# Patient Record
Sex: Female | Born: 1945 | Race: White | Hispanic: No | Marital: Single | Smoking: Former smoker
Health system: Southern US, Community
[De-identification: ages and names within clinical notes are randomized; demographics above are authoritative.]

## PROBLEM LIST (undated history)

## (undated) DIAGNOSIS — E079 Disorder of thyroid, unspecified: Secondary | ICD-10-CM

## (undated) DIAGNOSIS — J449 Chronic obstructive pulmonary disease, unspecified: Secondary | ICD-10-CM

## (undated) DIAGNOSIS — E119 Type 2 diabetes mellitus without complications: Secondary | ICD-10-CM

## (undated) DIAGNOSIS — J189 Pneumonia, unspecified organism: Secondary | ICD-10-CM

## (undated) DIAGNOSIS — E039 Hypothyroidism, unspecified: Secondary | ICD-10-CM

## (undated) DIAGNOSIS — T8859XA Other complications of anesthesia, initial encounter: Secondary | ICD-10-CM

## (undated) DIAGNOSIS — I10 Essential (primary) hypertension: Secondary | ICD-10-CM

## (undated) DIAGNOSIS — Z87442 Personal history of urinary calculi: Secondary | ICD-10-CM

## (undated) DIAGNOSIS — T4145XA Adverse effect of unspecified anesthetic, initial encounter: Secondary | ICD-10-CM

## (undated) DIAGNOSIS — D649 Anemia, unspecified: Secondary | ICD-10-CM

## (undated) HISTORY — PX: TONSILLECTOMY: SUR1361

## (undated) HISTORY — PX: ABDOMINAL HYSTERECTOMY: SHX81

## (undated) HISTORY — PX: HEMORRHOIDECTOMY WITH HEMORRHOID BANDING: SHX5633

---

## 2016-03-09 ENCOUNTER — Encounter (HOSPITAL_COMMUNITY): Payer: Self-pay | Admitting: Emergency Medicine

## 2016-03-09 ENCOUNTER — Emergency Department (HOSPITAL_COMMUNITY)
Admission: EM | Admit: 2016-03-09 | Discharge: 2016-03-09 | Disposition: A | Discharge status: Home / Self Care | Payer: Medicare Other | Attending: Emergency Medicine | Admitting: Emergency Medicine

## 2016-03-09 DIAGNOSIS — F17200 Nicotine dependence, unspecified, uncomplicated: Secondary | ICD-10-CM | POA: Diagnosis not present

## 2016-03-09 DIAGNOSIS — T7840XA Allergy, unspecified, initial encounter: Secondary | ICD-10-CM | POA: Diagnosis not present

## 2016-03-09 DIAGNOSIS — I10 Essential (primary) hypertension: Secondary | ICD-10-CM | POA: Insufficient documentation

## 2016-03-09 DIAGNOSIS — R220 Localized swelling, mass and lump, head: Secondary | ICD-10-CM | POA: Diagnosis present

## 2016-03-09 HISTORY — DX: Essential (primary) hypertension: I10

## 2016-03-09 HISTORY — DX: Disorder of thyroid, unspecified: E07.9

## 2016-03-09 MED ORDER — DIPHENHYDRAMINE HCL 50 MG/ML IJ SOLN
25.0000 mg | Freq: Once | INTRAMUSCULAR | Status: AC
  Administered 2016-03-09: 25 mg via INTRAVENOUS
  Filled 2016-03-09: qty 1

## 2016-03-09 MED ORDER — METHYLPREDNISOLONE SODIUM SUCC 125 MG IJ SOLR
125.0000 mg | Freq: Once | INTRAMUSCULAR | Status: AC
  Administered 2016-03-09: 125 mg via INTRAVENOUS
  Filled 2016-03-09: qty 2

## 2016-03-09 MED ORDER — AZITHROMYCIN 250 MG PO TABS: 250.0000 mg | ORAL_TABLET | Freq: Every day | ORAL | 0 refills | Status: DC

## 2016-03-09 NOTE — Discharge Instructions (Signed)
Refrain from taking your Benzapril and Augmentin. Take your new antibiotic as prescribed for your sinus infection. You may also continue to take benadryl as prescribed over the counter as needed until the swelling of your tongue has completely resolved. Follow up with your primary care provider in 2 days for follow up and management of your blood pressure since your BP was noted to be lower in the ED today and recently stopping your Benzapril due to concern for angioedema reaction. Please return to the Emergency Department if symptoms worsen or new onset of fever, facial/neck swelling, swelling of tongue, difficulty swallowing, unable swallow resulting in drooling, sensation of throat closing, difficulty breathing, wheezing.

## 2016-03-09 NOTE — ED Provider Notes (Signed)
[facility] DEPT Provider Note   CSN: [id number] Arrival date & time: 03/09/16  2426     History   Chief Complaint Chief Complaint  Patient presents with  . Oral Swelling    right side of tongue is swollen    HPI Diane Olson is a 70 y.o. female.  Patient is a 70 year old female with past medical history of hypertension and thyroid disease who presents the ED with complaint of swelling to the right side of her tongue, onset 2 hours prior to arrival. Patient reports when she woke up this morning she noticed swelling to the right side of her tongue. She notes the swelling has remained constant over the past 2 hours. Denies fever, difficulty breathing, wheezing, sensation of throat closing, difficulty swallowing, voice change, headache, drooling, abdominal pain, vomiting. Patient notes she was seen by her PCP yesterday for her sinus infection and states she was started on Augmentin which she took last night. Patient states she has taken Augmentin in the past without any side effects. She notes the only other medication she currently is taking are metoprolol and Benzapril (which she notes she has been taking for the past 5-10 years). Denies any other new medications, foods or liquids. Patient denies any known allergies.      Past Medical History:  Diagnosis Date  . Hypertension   . Thyroid disease     There are no active problems to display for this patient.   No past surgical history on file.  OB History    No data available       Home Medications    Prior to Admission medications   Medication Sig Start Date End Date Taking? Authorizing Provider  azithromycin (ZITHROMAX) 250 MG tablet Take 1 tablet (250 mg total) by mouth daily. Take first 2 tablets together, then 1 every day until finished. 03/09/16   Nona Dell, PA-C    Family History No family history on file.  Social History Social History  Substance Use Topics  . Smoking status: Current Every  Day Smoker  . Smokeless tobacco: Current User  . Alcohol use No     Allergies   Review of patient's allergies indicates not on file.   Review of Systems Review of Systems  HENT:       Swelling of right side of tongue  All other systems reviewed and are negative.    Physical Exam Updated Vital Signs BP 93/68 (BP Location: Left Arm)   Pulse 88   Temp 98.1 F (36.7 C) (Oral)   Resp 16   Ht '5\' 3"'$  (1.6 m)   Wt 53.1 kg   SpO2 96%   BMI 20.73 kg/m   Physical Exam  Constitutional: She is oriented to person, place, and time. She appears well-developed and well-nourished. No distress.  Healthy, thin appearing elderly female  HENT:  Head: Normocephalic and atraumatic.  Mouth/Throat: Uvula is midline, oropharynx is clear and moist and mucous membranes are normal. No oral lesions. No trismus in the jaw. No uvula swelling. No oropharyngeal exudate, posterior oropharyngeal edema, posterior oropharyngeal erythema or tonsillar abscesses. No tonsillar exudate.  Moderate swelling noted to right side of tongue, still able to visualize bilateral tonsils and posterior oropharynx. Floor of mouth soft. Pt tolerating secretions.   Eyes: Conjunctivae and EOM are normal. Right eye exhibits no discharge. Left eye exhibits no discharge. No scleral icterus.  Neck: Normal range of motion. Neck supple.  No facial or neck swelling  Cardiovascular: Normal rate, regular rhythm,  normal heart sounds and intact distal pulses.   Pulmonary/Chest: Effort normal and breath sounds normal. No respiratory distress. She has no wheezes. She has no rales. She exhibits no tenderness.  Airway intact.  Abdominal: Soft. Bowel sounds are normal. She exhibits no distension and no mass. There is no tenderness. There is no rebound and no guarding. No hernia.  Musculoskeletal: Normal range of motion. She exhibits no edema.  Neurological: She is alert and oriented to person, place, and time.  Skin: Skin is warm and dry. She is  not diaphoretic.  Nursing note and vitals reviewed.    ED Treatments / Results  Labs (all labs ordered are listed, but only abnormal results are displayed) Labs Reviewed - No data to display  EKG  EKG Interpretation None       Radiology No results found.  Procedures Procedures (including critical care time)  Medications Ordered in ED Medications  diphenhydrAMINE (BENADRYL) injection 25 mg (25 mg Intravenous Given 03/09/16 0804)  methylPREDNISolone sodium succinate (SOLU-MEDROL) 125 mg/2 mL injection 125 mg (125 mg Intravenous Given 03/09/16 0804)     Initial Impression / Assessment and Plan / ED Course  I have reviewed the triage vital signs and the nursing notes.  Pertinent labs & imaging results that were available during my care of the patient were reviewed by me and considered in my medical decision making (see chart for details).  Clinical Course   Patient presents with swelling to the right side of her tongue that started 2 hours prior to arrival and has remained constant and unchanged. Reports starting Augmentin yesterday for sinus infection but notes she has taken it before without any side effects. Denies any other new medications or changes in her home meds, endorses taking metoprolol and benzapril for HTN. Initial vitals, HR 109, BP 93/75, remaining vitals stable. Exam revealed moderate swelling to right side of tongue, pt tolerating secretions, airway intact. Pt without any signs of respiratory distress. Pt given IV benadryl and steroids.   8:30am- On reevaluation patient is resting comfortably in bed. Denies worsening of tongue swelling. On reexamination, swelling of right side of tongue is unchanged. Airway remains intact. Patient tolerating secretions and PO without any complications. Repeat vitals HR 86, BP 93/69. Pt denies headache, lightheadedness, dizziness, weakness, numbness, tingling, weakness, nausea. Pt able to stand and ambulate without assistance and  denies any sxs.   10:30am- on reevaluation patient reports improvement of tongue swelling. Reexamination revealed significant improvement of swelling to right side of tongue. Patient was observed in the ED for over 3 hours with improvement of symptoms, pt is hemodynamically stable. BP unchanged, pt asymptomatic and able to ambulate. Advised pt to refrain from taking her BP medication this morning. Plan to d/c pt home. Advised pt to refrain from taking benzapril due to suspicion of mild angioedema side effect seen one exam. Daughter is also concerned about pt having reaction to Augmentin and is requesting a different antibiotic for sinus infection. Will start pt on azithromycin. Advised pt to follow up with her PCP in 2 days for follow up regarding further management of BP and recent medication changes. Discussed strict return precautions with pt.   Final Clinical Impressions(s) / ED Diagnoses   Final diagnoses:  Allergic reaction, initial encounter    New Prescriptions New Prescriptions   AZITHROMYCIN (ZITHROMAX) 250 MG TABLET    Take 1 tablet (250 mg total) by mouth daily. Take first 2 tablets together, then 1 every day until finished.  Chesley Noon [place], [place] 03/09/16 [place], MD 03/10/16 [phone]

## 2016-03-09 NOTE — ED Triage Notes (Signed)
Pt. Stated, about 2 hours ago the right side of my tongue is swollen

## 2017-09-01 ENCOUNTER — Other Ambulatory Visit: Payer: Self-pay

## 2017-09-01 ENCOUNTER — Emergency Department (HOSPITAL_COMMUNITY): Payer: Medicare Other

## 2017-09-01 ENCOUNTER — Encounter (HOSPITAL_COMMUNITY): Payer: Self-pay | Admitting: *Deleted

## 2017-09-01 ENCOUNTER — Emergency Department (HOSPITAL_COMMUNITY)
Admission: EM | Admit: 2017-09-01 | Discharge: 2017-09-01 | Disposition: A | Discharge status: Home / Self Care | Payer: Medicare Other | Attending: Emergency Medicine | Admitting: Emergency Medicine

## 2017-09-01 DIAGNOSIS — F17210 Nicotine dependence, cigarettes, uncomplicated: Secondary | ICD-10-CM | POA: Insufficient documentation

## 2017-09-01 DIAGNOSIS — R918 Other nonspecific abnormal finding of lung field: Secondary | ICD-10-CM | POA: Diagnosis not present

## 2017-09-01 DIAGNOSIS — R05 Cough: Secondary | ICD-10-CM | POA: Diagnosis present

## 2017-09-01 DIAGNOSIS — E039 Hypothyroidism, unspecified: Secondary | ICD-10-CM | POA: Diagnosis not present

## 2017-09-01 DIAGNOSIS — I10 Essential (primary) hypertension: Secondary | ICD-10-CM | POA: Insufficient documentation

## 2017-09-01 DIAGNOSIS — R059 Cough, unspecified: Secondary | ICD-10-CM

## 2017-09-01 LAB — COMPREHENSIVE METABOLIC PANEL
ALT: 14 U/L (ref 14–54)
ANION GAP: 13 (ref 5–15)
AST: 22 U/L (ref 15–41)
Albumin: 4 g/dL (ref 3.5–5.0)
Alkaline Phosphatase: 54 U/L (ref 38–126)
BUN: 15 mg/dL (ref 6–20)
CHLORIDE: 101 mmol/L (ref 101–111)
CO2: 26 mmol/L (ref 22–32)
CREATININE: 0.83 mg/dL (ref 0.44–1.00)
Calcium: 10.1 mg/dL (ref 8.9–10.3)
GFR calc non Af Amer: >60 mL/min (ref >60)
Glucose, Bld: 101 mg/dL — ABNORMAL HIGH (ref 65–99)
Potassium: 3.6 mmol/L (ref 3.5–5.1)
SODIUM: 140 mmol/L (ref 135–145)
Total Bilirubin: 0.7 mg/dL (ref 0.3–1.2)
Total Protein: 7.7 g/dL (ref 6.5–8.1)

## 2017-09-01 LAB — CBC WITH DIFFERENTIAL/PLATELET
Basophils Absolute: 0 10*3/uL (ref 0.0–0.1)
Basophils Relative: 0 %
EOS ABS: 0.1 10*3/uL (ref 0.0–0.7)
EOS PCT: 1 %
HCT: 43.1 % (ref 36.0–46.0)
Hemoglobin: 14.3 g/dL (ref 12.0–15.0)
LYMPHS ABS: 2 10*3/uL (ref 0.7–4.0)
Lymphocytes Relative: 18 %
MCH: 28.9 pg (ref 26.0–34.0)
MCHC: 33.2 g/dL (ref 30.0–36.0)
MCV: 87.2 fL (ref 78.0–100.0)
Monocytes Absolute: 0.8 10*3/uL (ref 0.1–1.0)
Monocytes Relative: 7 %
Neutro Abs: 8.2 10*3/uL — ABNORMAL HIGH (ref 1.7–7.7)
Neutrophils Relative %: 74 %
PLATELETS: 303 10*3/uL (ref 150–400)
RBC: 4.94 MIL/uL (ref 3.87–5.11)
RDW: 14 % (ref 11.5–15.5)
WBC: 11.1 10*3/uL — AB (ref 4.0–10.5)

## 2017-09-01 MED ORDER — IOPAMIDOL (ISOVUE-300) INJECTION 61%
INTRAVENOUS | Status: AC
  Administered 2017-09-01: 75 mL via INTRAVENOUS
  Filled 2017-09-01: qty 75

## 2017-09-01 MED ORDER — DM-GUAIFENESIN ER 30-600 MG PO TB12: 1.0000 | ORAL_TABLET | Freq: Two times a day (BID) | ORAL | 0 refills | Status: DC

## 2017-09-01 NOTE — ED Notes (Signed)
Transported to CT 

## 2017-09-01 NOTE — ED Triage Notes (Addendum)
To ED for eval of 2 episodes of blood in sputum since yesterday. No SOB. States she was dx with pneumonia in Jan and has a lingering cough since. Appears in nad. Hx of HTN and taking meds

## 2017-09-01 NOTE — Discharge Instructions (Addendum)
Your chest CT scan shows a 3x4cm mass in the left lung concerning for lung cancer, I have placed a referral to the oncology clinic if you do not hear from them in the next few days please give them a call, you will also need to follow-up with your primary care doctor in the next few days.  You may use the cough medicine prescribed as needed please follow-up with your primary doctor for continued management of this cough.  If you develop chest pain, shortness of breath, fevers or chills, or cough up frank blood, not just blood mixed in the sputum please return to the ED immediately for reevaluation.

## 2017-09-01 NOTE — ED Provider Notes (Signed)
[facility] EMERGENCY DEPARTMENT Provider Note   CSN: [id number] Arrival date & time: 09/01/17  4034     History   Chief Complaint Chief Complaint  Patient presents with  . Cough    HPI Diane Olson is a 72 y.o. female.  Diane Olson is a 72 y.o. Female with a history of hypertension, thyroid disease and tobacco use, presents to the ED for evaluation of persistent cough with a few episodes of blood-streaked sputum over the past 3 days.  She reports she was diagnosed with pneumonia at the very end of January, treated with steroids and doxycycline.  Patient reports overall symptoms improved but she has had a lingering cough since then.  Cough seems to be improving overall but over the past few days she has had increased coughing, specifically when she wakes up first thing in the morning she has some rapid breathing and intense coughing spell and then has coughed up mucus with some blood streaked in it.  No episodes of gross hemoptysis.  Patient denies chest pain or shortness of breath at rest only some soreness from frequent coughing.  She has been using Delsym, has not tried anything else for cough.  No fevers or chills.  No abdominal pain, nausea, vomiting or diarrhea.  Patient reports she is still an active smoker and smokes somewhere between half a pack to a pack a day      Past Medical History:  Diagnosis Date  . Hypertension   . Thyroid disease     There are no active problems to display for this patient.   Past Surgical History:  Procedure Laterality Date  . ABDOMINAL HYSTERECTOMY      OB History    No data available       Home Medications    Prior to Admission medications   Medication Sig Start Date End Date Taking? Authorizing Provider  azithromycin (ZITHROMAX) 250 MG tablet Take 1 tablet (250 mg total) by mouth daily. Take first 2 tablets together, then 1 every day until finished. 03/09/16   Nona Dell, PA-C    Family  History No family history on file.  Social History Social History   Tobacco Use  . Smoking status: Current Every Day Smoker  . Smokeless tobacco: Current User  Substance Use Topics  . Alcohol use: No  . Drug use: No     Allergies   Augmentin [amoxicillin-pot clavulanate] and Lisinopril   Review of Systems Review of Systems  Constitutional: Negative for chills and fever.  HENT: Negative for congestion, rhinorrhea and sore throat.   Eyes: Negative for visual disturbance.  Respiratory: Positive for cough. Negative for chest tightness, shortness of breath and wheezing.   Cardiovascular: Negative for chest pain.  Gastrointestinal: Negative for abdominal pain, diarrhea, nausea and vomiting.  Genitourinary: Negative for dysuria.  Musculoskeletal: Negative for arthralgias and myalgias.  Skin: Negative for rash.  Neurological: Negative for dizziness, weakness, light-headedness and headaches.  All other systems reviewed and are negative.    Physical Exam Updated Vital Signs BP 123/90 (BP Location: Right Arm)   Pulse 92   Temp 98.4 F (36.9 C) (Oral)   Resp 18   SpO2 99%   Physical Exam  Constitutional: She appears well-developed and well-nourished. No distress.  HENT:  Head: Normocephalic and atraumatic.  Mouth/Throat: Oropharynx is clear and moist.  TMs clear with good landmarks, mild nasal mucosa edema with clear rhinorrhea, posterior oropharynx clear and moist, with some erythema, no edema or exudates  Eyes: Right eye exhibits no discharge. Left eye exhibits no discharge.  Neck: Neck supple.  Cardiovascular: Normal rate, regular rhythm and normal heart sounds.  Pulmonary/Chest: Effort normal. No stridor. No respiratory distress. She has no wheezes. She has no rales.  Few scattered crackles feel this is likely chronic given patient's smoking history, lungs otherwise clear with good air movement throughout  Abdominal: Soft. Bowel sounds are normal. She exhibits no  distension and no mass. There is no tenderness. There is no guarding.  Musculoskeletal: She exhibits no edema or deformity.  Neurological: She is alert. Coordination normal.  Skin: Skin is warm and dry. Capillary refill takes less than 2 seconds. She is not diaphoretic.  Psychiatric: She has a normal mood and affect. Her behavior is normal.  Nursing note and vitals reviewed.    ED Treatments / Results  Labs (all labs ordered are listed, but only abnormal results are displayed) Labs Reviewed  CBC WITH DIFFERENTIAL/PLATELET - Abnormal; Notable for the following components:      Result Value   WBC 11.1 (*)    Neutro Abs 8.2 (*)    All other components within normal limits  COMPREHENSIVE METABOLIC PANEL - Abnormal; Notable for the following components:   Glucose, Bld 101 (*)    All other components within normal limits    EKG  EKG Interpretation None       Radiology Dg Chest 2 View  Result Date: 09/01/2017 CLINICAL DATA:  Two episodes of hemoptysis since yesterday. No shortness of breath, persistent cough since diagnosis with pneumonia in January 2019. Current smoker. EXAM: CHEST  2 VIEW COMPARISON:  None in PACs FINDINGS: There is abnormal soft tissue density in the left infrahilar region likely in the anterior aspect of the lower lobe. More peripherally there is subtle nodular density measuring just under 1 cm in diameter. The right lung is well-expanded. The interstitial markings are both lungs are coarse. There is no significant pleural effusion. The heart and pulmonary vascularity are normal. There is calcification in the wall of the thoracic aorta. There is a small amount of apical pleural thickening bilaterally. The bony thorax is unremarkable. IMPRESSION: COPD or reactive airway disease. Abnormal soft tissue density in the left infrahilar region and more peripherally in the left lower lobe worrisome for malignancy. Atypical pneumonia could be present. Chest CT scanning is  recommended. Thoracic aortic atherosclerosis. Electronically Signed   By: David  Martinique M.D.   On: 09/01/2017 10:16   Ct Chest W Contrast  Result Date: 09/01/2017 CLINICAL DATA:  Persistent lingering cough. Recent pneumonia. Occasional hemoptysis. Findings on radiographs worrisome for malignancy. EXAM: CT CHEST WITH CONTRAST TECHNIQUE: Multidetector CT imaging of the chest was performed during intravenous contrast administration. CONTRAST:  <75 cc ISOVUE-300 IOPAMIDOL (ISOVUE-300) INJECTION 61% COMPARISON:  Radiographs 09/01/2017.  No other comparison studies. FINDINGS: Cardiovascular: Diffuse atherosclerosis of the aorta, great vessels and coronary arteries. No acute vascular findings are evident. The heart size is normal. There is no pericardial effusion. Mediastinum/Nodes: There is a large central mediastinal mass which splays the carina and exerts mass effect on the central bronchi bilaterally. This measures approximately 5.2 x 4.3 cm on image 58/3. This mass is contiguous with subcarinal and right hilar adenopathy, the latter measuring up to 2.3 x 1.8 cm on image 65/3. Central mediastinal mass indents the trachea anteriorly and may invade it (image 49/3). There is a small hiatal hernia. The thyroid gland is not clearly visualized. Lungs/Pleura: There is no pleural effusion or pneumothorax. There  is a left infrahilar mass measuring 4.1 x 3.1 cm (image 87/4). This obstructs the anteromedial basal segment of the left lower lobe bronchus and appears cavitary peripherally. There is mild patchy airspace disease peripheral to this mass. No other suspicious pulmonary nodules. There is mild biapical scarring and mild centrilobular emphysema. Upper abdomen: No highly suspect findings within the visualized upper abdomen. There are several low-density hepatic lesions which are probably cysts. There are probable left renal cysts. No evidence of adrenal mass. Musculoskeletal/Chest wall: There is no chest wall mass or  suspicious osseous finding. IMPRESSION: 1. 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 There is associated central mediastinal lymphadenopathy with mass effect on the tracheobronchial tree and possible tracheal invasion. Tissue sampling recommended. 2. No distant metastases identified within the chest. Mild emphysema and biapical scarring. 3.  Aortic Atherosclerosis (ICD10-I70.0). 4. No highly suspect findings in the visualized upper abdomen. Electronically Signed   By: Richardean Sale M.D.   On: 09/01/2017 14:23    Procedures Procedures (including critical care time)  Medications Ordered in ED Medications  iopamidol (ISOVUE-300) 61 % injection (75 mLs Intravenous Contrast Given 09/01/17 1401)     Initial Impression / Assessment and Plan / ED Course  I have reviewed the triage vital signs and the nursing notes.  Pertinent labs & imaging results that were available during my care of the patient were reviewed by me and considered in my medical decision making (see chart for details).  Patient presents to the ED for evaluation of persistent coughing for 1 month after diagnosis of pneumonia, with 3 episodes of blood-streaked sputum over the past few days.  Patient initially hypertensive but vitals otherwise normal and hypertension resolved without intervention.  No evidence of respiratory distress, patient denies shortness of breath or chest pain.  Chest x-ray ordered from triage shows abnormal soft tissue density in the left hilar region concerning for malignancy versus atypical pneumonia, suggest follow-up with CT of the chest.  Discussed these findings with patient and daughter will proceed with basic labs and contrasted CT of the chest.  Labs show mild leukocytosis of 11.1, normal hemoglobin and platelets.  No electrolyte derangements requiring intervention.  Chest CT shows a cavitary left lower lobe mass that is highly suspicious for bronchogenic carcinoma, there is  associated central mediastinal lymphadenopathy with mass-effect on the tracheobronchial tree and possible tracheal invasion.  Biopsy is recommended.  CT shows mild apical scarring and emphysema, no distant chest metastasis or highly suspect findings visualized in the upper abdomen.  Results discussed with Dr. Rogene Houston given that patient is not acutely symptomatic is not experiencing any chest pain or shortness of breath and has had normal vitals throughout her stay here today feels she is stable for outpatient follow-up and evaluation of this, will provide referral to the oncology clinic and have patient follow-up with her primary care provider as well.  Discussed these results at length with patient and daughter, provided copy of CT report to the patient, discussed next steps to anticipate with oncology.  Patient expresses understanding.  At this time she is stable for discharge home, discussed with patient that if she does not hear from the oncology clinic in the next 2 days she is to give them a call herself, phone number provided on paperwork.  Strict return precautions discussed with the patient she expresses understanding and is in agreement with plan.  Patient discussed with Dr. Rogene Houston who is in agreement with plan.  Final Clinical Impressions(s) /  ED Diagnoses   Final diagnoses:  Lung mass  Cough    ED Discharge Orders        Ordered    Ambulatory referral to Hematology / Oncology     09/01/17 1517    [facility]orphan[facility]) 30-600 MG 12hr tablet  2 times daily     09/01/17 1522       Jacqlyn Larsen, [place] 09/01/17 1719    Fredia Sorrow, MD 09/02/17 [phone]

## 2017-09-01 NOTE — ED Notes (Signed)
Patient returned from CT

## 2017-09-02 ENCOUNTER — Telehealth: Payer: Self-pay | Admitting: Internal Medicine

## 2017-09-02 ENCOUNTER — Encounter: Payer: Self-pay | Admitting: Internal Medicine

## 2017-09-02 NOTE — Telephone Encounter (Signed)
Appt has been scheduled for the pt to see Dr. Julien Nordmann on 3/18 at 215pm. Pt aware to arrive 30 minutes early. Letter mailed.

## 2017-09-12 ENCOUNTER — Other Ambulatory Visit: Payer: Self-pay | Admitting: Medical Oncology

## 2017-09-12 DIAGNOSIS — R918 Other nonspecific abnormal finding of lung field: Secondary | ICD-10-CM

## 2017-09-15 ENCOUNTER — Inpatient Hospital Stay: Payer: Medicare Other | Attending: Internal Medicine | Admitting: Internal Medicine

## 2017-09-15 ENCOUNTER — Encounter: Payer: Self-pay | Admitting: Internal Medicine

## 2017-09-15 ENCOUNTER — Inpatient Hospital Stay: Payer: Medicare Other

## 2017-09-15 ENCOUNTER — Other Ambulatory Visit: Payer: Self-pay | Admitting: Medical Oncology

## 2017-09-15 DIAGNOSIS — R5383 Other fatigue: Secondary | ICD-10-CM | POA: Insufficient documentation

## 2017-09-15 DIAGNOSIS — Z8701 Personal history of pneumonia (recurrent): Secondary | ICD-10-CM | POA: Insufficient documentation

## 2017-09-15 DIAGNOSIS — Z79899 Other long term (current) drug therapy: Secondary | ICD-10-CM | POA: Insufficient documentation

## 2017-09-15 DIAGNOSIS — E785 Hyperlipidemia, unspecified: Secondary | ICD-10-CM | POA: Diagnosis not present

## 2017-09-15 DIAGNOSIS — E039 Hypothyroidism, unspecified: Secondary | ICD-10-CM | POA: Diagnosis not present

## 2017-09-15 DIAGNOSIS — I700 Atherosclerosis of aorta: Secondary | ICD-10-CM | POA: Insufficient documentation

## 2017-09-15 DIAGNOSIS — J189 Pneumonia, unspecified organism: Secondary | ICD-10-CM | POA: Diagnosis not present

## 2017-09-15 DIAGNOSIS — Z716 Tobacco abuse counseling: Secondary | ICD-10-CM | POA: Insufficient documentation

## 2017-09-15 DIAGNOSIS — I10 Essential (primary) hypertension: Secondary | ICD-10-CM | POA: Diagnosis not present

## 2017-09-15 DIAGNOSIS — R918 Other nonspecific abnormal finding of lung field: Secondary | ICD-10-CM | POA: Diagnosis present

## 2017-09-15 DIAGNOSIS — C3490 Malignant neoplasm of unspecified part of unspecified bronchus or lung: Secondary | ICD-10-CM

## 2017-09-15 DIAGNOSIS — K769 Liver disease, unspecified: Secondary | ICD-10-CM | POA: Diagnosis not present

## 2017-09-15 DIAGNOSIS — E119 Type 2 diabetes mellitus without complications: Secondary | ICD-10-CM | POA: Insufficient documentation

## 2017-09-15 DIAGNOSIS — J984 Other disorders of lung: Secondary | ICD-10-CM

## 2017-09-15 DIAGNOSIS — R042 Hemoptysis: Secondary | ICD-10-CM | POA: Insufficient documentation

## 2017-09-15 DIAGNOSIS — Z801 Family history of malignant neoplasm of trachea, bronchus and lung: Secondary | ICD-10-CM | POA: Diagnosis not present

## 2017-09-15 DIAGNOSIS — F17210 Nicotine dependence, cigarettes, uncomplicated: Secondary | ICD-10-CM | POA: Diagnosis not present

## 2017-09-15 DIAGNOSIS — J439 Emphysema, unspecified: Secondary | ICD-10-CM

## 2017-09-15 LAB — CMP (CANCER CENTER ONLY)
ALBUMIN: 3.9 g/dL (ref 3.5–5.0)
ALK PHOS: 59 U/L (ref 40–150)
ALT: 12 U/L (ref 0–55)
AST: 11 U/L (ref 5–34)
Anion gap: 13 — ABNORMAL HIGH (ref 3–11)
BILIRUBIN TOTAL: 0.4 mg/dL (ref 0.2–1.2)
BUN: 23 mg/dL (ref 7–26)
CALCIUM: 11 mg/dL — AB (ref 8.4–10.4)
CO2: 27 mmol/L (ref 22–29)
Chloride: 100 mmol/L (ref 98–109)
Creatinine: 0.89 mg/dL (ref 0.60–1.10)
GFR, Est AFR Am: >60 mL/min (ref >60)
GFR, Estimated: >60 mL/min (ref >60)
GLUCOSE: 99 mg/dL (ref 70–140)
Potassium: 3.8 mmol/L (ref 3.5–5.1)
Sodium: 140 mmol/L (ref 136–145)
TOTAL PROTEIN: 7.8 g/dL (ref 6.4–8.3)

## 2017-09-15 LAB — CBC WITH DIFFERENTIAL (CANCER CENTER ONLY)
BASOS ABS: 0.1 10*3/uL (ref 0.0–0.1)
BASOS PCT: 1 %
Eosinophils Absolute: 0 10*3/uL (ref 0.0–0.5)
Eosinophils Relative: 0 %
HCT: 45.6 % (ref 34.8–46.6)
Hemoglobin: 15 g/dL (ref 11.6–15.9)
LYMPHS PCT: 12 %
Lymphs Abs: 1.7 10*3/uL (ref 0.9–3.3)
MCH: 28.3 pg (ref 25.1–34.0)
MCHC: 32.9 g/dL (ref 31.5–36.0)
MCV: 86.1 fL (ref 79.5–101.0)
MONO ABS: 1.1 10*3/uL — AB (ref 0.1–0.9)
Monocytes Relative: 8 %
Neutro Abs: 11.1 10*3/uL — ABNORMAL HIGH (ref 1.5–6.5)
Neutrophils Relative %: 79 %
Platelet Count: 290 10*3/uL (ref 145–400)
RBC: 5.3 MIL/uL (ref 3.70–5.45)
RDW: 14.4 % (ref 11.2–14.5)
WBC Count: 14 10*3/uL — ABNORMAL HIGH (ref 3.9–10.3)

## 2017-09-15 NOTE — Progress Notes (Signed)
[place] [phone]   [phone]  CONSULT NOTE  REFERRING PHYSICIAN: Benedetto Goad, PA-C  REASON FOR CONSULTATION:  72 years old white female with questionable lung cancer.  HPI Diane Olson is a 72 y.o. female with past medical history significant for hypertension, dyslipidemia, diabetes mellitus, hypothyroidism and long history for smoking.  The patient mentioned that in January 2019 she was diagnosed with pneumonia and treated with a course of antibiotics.  She felt better initially then she started having laryngitis and persistent cough.  2 weeks ago she had 2 episodes of cough with hemoptysis as well as shortness of breath.  She presented to the emergency department at [facility].  Chest x-ray on 09/01/2017 showed abnormal soft tissue density in the left infrahilar region.  This was followed by CT scan of the chest on the same day and that showed a large central mediastinal mass which splays the carina and exerts mass-effect on the central bronchi bilaterally.  This measured 5.2 x 4.3 cm.  The mass is contiguous with the subcarinal and right hilar adenopathy.  The later measuring up to 2.3 x 1.8 cm.  There is central mediastinal mass indents the trachea anteriorly and may invade it.  There was also a left infrahilar mass measuring 4.1 x 3.1 cm and this mass obstructs the anterior medial basal segment of the left lower lobe bronchus and appears cavitary peripherally.  No other distant metastases identified within the chest. The patient was referred to me today for evaluation and recommendation regarding these abnormal findings. When seen today she continues to complain of cough productive of whitish and occasional bloody sputum.  She denied having any chest pain but has shortness of breath at baseline increased with cough.  She lost around 8 pounds in the last 3 months.  She denied having any nausea, vomiting, diarrhea or constipation.  She denied  having any fever or chills.  She has no headache or visual changes. Family history significant for mother with lung cancer in her 5s, brother also had lung cancer, father died before she was born. The patient is single and has 2 daughters.  She was accompanied today by her daughter [name].  She used to work in [employer].  She has a history for smoking less than 1 pack/day for around 60 years and unfortunately she continues to smoke and I strongly encouraged her to quit smoking.  She has no history of alcohol or drug abuse.  HPI  Past Medical History:  Diagnosis Date  . Hypertension   . Thyroid disease     Past Surgical History:  Procedure Laterality Date  . ABDOMINAL HYSTERECTOMY      No family history on file.  Social History Social History   Tobacco Use  . Smoking status: Current Every Day Smoker    Packs/day: 0.50    Years: 60.00    Pack years: 30.00  Substance Use Topics  . Alcohol use: No  . Drug use: No    Allergies  Allergen Reactions  . Amoxicillin-Pot Clavulanate Swelling  . Benazepril Swelling  . Lisinopril     Current Outpatient Medications  Medication Sig Dispense Refill  . azithromycin (ZITHROMAX) 250 MG tablet Take 1 tablet (250 mg total) by mouth daily. Take first 2 tablets together, then 1 every day until finished. 6 tablet 0  . dextromethorphan-guaiFENesin (MUCINEX DM) 30-600 MG 12hr tablet Take 1 tablet by mouth 2 (two) times daily. 30 tablet 0  No current facility-administered medications for this visit.     Review of Systems  Constitutional: positive for fatigue and weight loss Eyes: negative Ears, nose, mouth, throat, and face: negative Respiratory: positive for cough, dyspnea on exertion, hemoptysis and sputum Cardiovascular: negative Gastrointestinal: negative Genitourinary:negative Integument/breast: negative Hematologic/lymphatic: negative Musculoskeletal:negative Neurological: negative Behavioral/Psych:  negative Endocrine: negative Allergic/Immunologic: negative  Physical Exam  KDT:OIZTI, healthy, no distress, well nourished, well developed and anxious SKIN: skin color, texture, turgor are normal, no rashes or significant lesions HEAD: Normocephalic, No masses, lesions, tenderness or abnormalities EYES: normal, PERRLA, Conjunctiva are pink and non-injected EARS: External ears normal, Canals clear OROPHARYNX:no exudate, no erythema and lips, buccal mucosa, and tongue normal  NECK: supple, no adenopathy, no JVD LYMPH:  no palpable lymphadenopathy, no hepatosplenomegaly BREAST:not examined LUNGS: scattered rhonchi bilaterally, scattered rales bilaterally HEART: regular rate & rhythm, no murmurs and no gallops ABDOMEN:abdomen soft, non-tender, normal bowel sounds and no masses or organomegaly BACK: Back symmetric, no curvature., No CVA tenderness EXTREMITIES:no joint deformities, effusion, or inflammation, no edema  NEURO: alert & oriented x 3 with fluent speech, no focal motor/sensory deficits  PERFORMANCE STATUS: ECOG 1  LABORATORY DATA: Lab Results  Component Value Date   WBC 14.0 (H) 09/15/2017   HGB 14.3 09/01/2017   HCT 45.6 09/15/2017   MCV 86.1 09/15/2017   PLT 290 09/15/2017      Chemistry      Component Value Date/Time   NA 140 09/15/2017 1403   K 3.8 09/15/2017 1403   CL 100 09/15/2017 1403   CO2 27 09/15/2017 1403   BUN 23 09/15/2017 1403   CREATININE 0.89 09/15/2017 1403      Component Value Date/Time   CALCIUM 11.0 (H) 09/15/2017 1403   ALKPHOS 59 09/15/2017 1403   AST 11 09/15/2017 1403   ALT 12 09/15/2017 1403   BILITOT 0.4 09/15/2017 1403       RADIOGRAPHIC STUDIES: Dg Chest 2 View  Result Date: 09/01/2017 CLINICAL DATA:  Two episodes of hemoptysis since yesterday. No shortness of breath, persistent cough since diagnosis with pneumonia in January 2019. Current smoker. EXAM: CHEST  2 VIEW COMPARISON:  None in PACs FINDINGS: There is abnormal soft  tissue density in the left infrahilar region likely in the anterior aspect of the lower lobe. More peripherally there is subtle nodular density measuring just under 1 cm in diameter. The right lung is well-expanded. The interstitial markings are both lungs are coarse. There is no significant pleural effusion. The heart and pulmonary vascularity are normal. There is calcification in the wall of the thoracic aorta. There is a small amount of apical pleural thickening bilaterally. The bony thorax is unremarkable. IMPRESSION: COPD or reactive airway disease. Abnormal soft tissue density in the left infrahilar region and more peripherally in the left lower lobe worrisome for malignancy. Atypical pneumonia could be present. Chest CT scanning is recommended. Thoracic aortic atherosclerosis. Electronically Signed   By: David  Martinique M.D.   On: 09/01/2017 10:16   Ct Chest W Contrast  Result Date: 09/01/2017 CLINICAL DATA:  Persistent lingering cough. Recent pneumonia. Occasional hemoptysis. Findings on radiographs worrisome for malignancy. EXAM: CT CHEST WITH CONTRAST TECHNIQUE: Multidetector CT imaging of the chest was performed during intravenous contrast administration. CONTRAST:  <75 cc ISOVUE-300 IOPAMIDOL (ISOVUE-300) INJECTION 61% COMPARISON:  Radiographs 09/01/2017.  No other comparison studies. FINDINGS: Cardiovascular: Diffuse atherosclerosis of the aorta, great vessels and coronary arteries. No acute vascular findings are evident. The heart size is normal. There is no  pericardial effusion. Mediastinum/Nodes: There is a large central mediastinal mass which splays the carina and exerts mass effect on the central bronchi bilaterally. This measures approximately 5.2 x 4.3 cm on image 58/3. This mass is contiguous with subcarinal and right hilar adenopathy, the latter measuring up to 2.3 x 1.8 cm on image 65/3. Central mediastinal mass indents the trachea anteriorly and may invade it (image 49/3). There is a small  hiatal hernia. The thyroid gland is not clearly visualized. Lungs/Pleura: There is no pleural effusion or pneumothorax. There is a left infrahilar mass measuring 4.1 x 3.1 cm (image 87/4). This obstructs the anteromedial basal segment of the left lower lobe bronchus and appears cavitary peripherally. There is mild patchy airspace disease peripheral to this mass. No other suspicious pulmonary nodules. There is mild biapical scarring and mild centrilobular emphysema. Upper abdomen: No highly suspect findings within the visualized upper abdomen. There are several low-density hepatic lesions which are probably cysts. There are probable left renal cysts. No evidence of adrenal mass. Musculoskeletal/Chest wall: There is no chest wall mass or suspicious osseous finding. IMPRESSION: 1. 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 There is associated central mediastinal lymphadenopathy with mass effect on the tracheobronchial tree and possible tracheal invasion. Tissue sampling recommended. 2. No distant metastases identified within the chest. Mild emphysema and biapical scarring. 3.  Aortic Atherosclerosis (ICD10-I70.0). 4. No highly suspect findings in the visualized upper abdomen. Electronically Signed   By: Richardean Sale M.D.   On: 09/01/2017 14:23    ASSESSMENT: This is a very pleasant 72 years old white female with highly suspicious lung cancer presented with cavitary left lower lobe lung mass in addition to central mediastinal lymphadenopathy with mass-effect on the tracheobronchial tree and possible tracheal invasion.   PLAN: I had a lengthy discussion with the patient and her daughter today about her current disease status and further investigation to confirm a diagnosis. I personally and independently reviewed the scan images and discussed the results and showed the images to the patient and her daughter. I recommended for the patient to complete the staging workup by  ordering a PET scan as well as MRI of the brain. Once the PET scan becomes available, I will arrange for the patient to have tissue biopsy either with bronchoscopy and endobronchial ultrasound or biopsy of any metastatic lesions that could be seen on the PET scan. I will arrange for the patient to come back for follow-up visit next week for reevaluation and discussion of her scan results and further recommendation regarding treatment of her condition. For cough she will continue with the current over-the-counter cough medications. For smoking cessation, I strongly encouraged the patient to quit smoking and offered her a smoke cessation program. The patient was advised to call immediately if she has any concerning symptoms in the interval. The patient voices understanding of current disease status and treatment options and is in agreement with the current care plan.  All questions were answered. The patient knows to call the clinic with any problems, questions or concerns. We can certainly see the patient much sooner if necessary.  Thank you so much for allowing me to participate in the care of Judd Lien. I will continue to follow up the patient with you and assist in her care.  I spent 40 minutes counseling the patient face to face. The total time spent in the appointment was 60 minutes.  Disclaimer: This note was dictated with voice recognition software. Similar sounding words  can inadvertently be transcribed and may not be corrected upon review.   Eilleen Kempf September 15, 2017, 3:05 PM

## 2017-09-24 ENCOUNTER — Ambulatory Visit: Payer: 59 | Admitting: Oncology

## 2017-09-26 ENCOUNTER — Telehealth: Payer: Self-pay | Admitting: *Deleted

## 2017-09-26 ENCOUNTER — Ambulatory Visit (HOSPITAL_COMMUNITY)
Admission: RE | Admit: 2017-09-26 | Discharge: 2017-09-26 | Disposition: A | Discharge status: Home / Self Care | Payer: Medicare Other | Source: Ambulatory Visit | Attending: Internal Medicine | Admitting: Internal Medicine

## 2017-09-26 ENCOUNTER — Inpatient Hospital Stay: Payer: Medicare Other

## 2017-09-26 ENCOUNTER — Inpatient Hospital Stay (HOSPITAL_BASED_OUTPATIENT_CLINIC_OR_DEPARTMENT_OTHER): Payer: Medicare Other | Admitting: Medical

## 2017-09-26 ENCOUNTER — Other Ambulatory Visit: Payer: Self-pay | Admitting: *Deleted

## 2017-09-26 VITALS — BP 91/67 | HR 111 | Temp 97.5°F | Resp 18 | Ht 63.0 in | Wt 103.1 lb

## 2017-09-26 DIAGNOSIS — R5383 Other fatigue: Secondary | ICD-10-CM

## 2017-09-26 DIAGNOSIS — R042 Hemoptysis: Secondary | ICD-10-CM | POA: Diagnosis not present

## 2017-09-26 DIAGNOSIS — K769 Liver disease, unspecified: Secondary | ICD-10-CM

## 2017-09-26 DIAGNOSIS — E785 Hyperlipidemia, unspecified: Secondary | ICD-10-CM | POA: Diagnosis not present

## 2017-09-26 DIAGNOSIS — I700 Atherosclerosis of aorta: Secondary | ICD-10-CM

## 2017-09-26 DIAGNOSIS — C3490 Malignant neoplasm of unspecified part of unspecified bronchus or lung: Secondary | ICD-10-CM | POA: Insufficient documentation

## 2017-09-26 DIAGNOSIS — R0602 Shortness of breath: Secondary | ICD-10-CM

## 2017-09-26 DIAGNOSIS — Z79899 Other long term (current) drug therapy: Secondary | ICD-10-CM

## 2017-09-26 DIAGNOSIS — I2510 Atherosclerotic heart disease of native coronary artery without angina pectoris: Secondary | ICD-10-CM | POA: Insufficient documentation

## 2017-09-26 DIAGNOSIS — J439 Emphysema, unspecified: Secondary | ICD-10-CM | POA: Diagnosis not present

## 2017-09-26 DIAGNOSIS — I714 Abdominal aortic aneurysm, without rupture: Secondary | ICD-10-CM | POA: Diagnosis not present

## 2017-09-26 DIAGNOSIS — R918 Other nonspecific abnormal finding of lung field: Secondary | ICD-10-CM

## 2017-09-26 DIAGNOSIS — E039 Hypothyroidism, unspecified: Secondary | ICD-10-CM

## 2017-09-26 DIAGNOSIS — J189 Pneumonia, unspecified organism: Secondary | ICD-10-CM

## 2017-09-26 DIAGNOSIS — I10 Essential (primary) hypertension: Secondary | ICD-10-CM | POA: Diagnosis not present

## 2017-09-26 DIAGNOSIS — Z8701 Personal history of pneumonia (recurrent): Secondary | ICD-10-CM

## 2017-09-26 DIAGNOSIS — E119 Type 2 diabetes mellitus without complications: Secondary | ICD-10-CM

## 2017-09-26 DIAGNOSIS — J181 Lobar pneumonia, unspecified organism: Principal | ICD-10-CM

## 2017-09-26 DIAGNOSIS — F17210 Nicotine dependence, cigarettes, uncomplicated: Secondary | ICD-10-CM | POA: Diagnosis not present

## 2017-09-26 DIAGNOSIS — Z801 Family history of malignant neoplasm of trachea, bronchus and lung: Secondary | ICD-10-CM

## 2017-09-26 LAB — CBC WITH DIFFERENTIAL (CANCER CENTER ONLY)
BASOS ABS: 0 10*3/uL (ref 0.0–0.1)
BASOS PCT: 0 %
EOS ABS: 0 10*3/uL (ref 0.0–0.5)
Eosinophils Relative: 0 %
HCT: 40.8 % (ref 34.8–46.6)
Hemoglobin: 13.6 g/dL (ref 11.6–15.9)
Lymphocytes Relative: 3 %
Lymphs Abs: 0.6 10*3/uL — ABNORMAL LOW (ref 0.9–3.3)
MCH: 28.8 pg (ref 25.1–34.0)
MCHC: 33.3 g/dL (ref 31.5–36.0)
MCV: 86.4 fL (ref 79.5–101.0)
Monocytes Absolute: 1.1 10*3/uL — ABNORMAL HIGH (ref 0.1–0.9)
Monocytes Relative: 6 %
NEUTROS PCT: 91 %
Neutro Abs: 16 10*3/uL — ABNORMAL HIGH (ref 1.5–6.5)
PLATELETS: 287 10*3/uL (ref 145–400)
RBC: 4.72 MIL/uL (ref 3.70–5.45)
RDW: 14.5 % (ref 11.2–14.5)
WBC: 17.7 10*3/uL — AB (ref 3.9–10.3)

## 2017-09-26 LAB — GLUCOSE, CAPILLARY: GLUCOSE-CAPILLARY: 155 mg/dL — AB (ref 65–99)

## 2017-09-26 MED ORDER — ALBUTEROL SULFATE (2.5 MG/3ML) 0.083% IN NEBU: 2.5000 mg | INHALATION_SOLUTION | Freq: Four times a day (QID) | RESPIRATORY_TRACT | 12 refills | Status: AC | PRN

## 2017-09-26 MED ORDER — SODIUM CHLORIDE 0.9 % IV SOLN: Freq: Once | INTRAVENOUS | Status: DC

## 2017-09-26 MED ORDER — GADOBENATE DIMEGLUMINE 529 MG/ML IV SOLN
10.0000 mL | Freq: Once | INTRAVENOUS | Status: AC | PRN
  Administered 2017-09-26: 9 mL via INTRAVENOUS

## 2017-09-26 MED ORDER — FLUDEOXYGLUCOSE F - 18 (FDG) INJECTION
5.4100 | Freq: Once | INTRAVENOUS | Status: AC | PRN
  Administered 2017-09-26: 5.41 via INTRAVENOUS

## 2017-09-26 MED ORDER — SULFAMETHOXAZOLE-TRIMETHOPRIM 800-160 MG PO TABS: 1.0000 | ORAL_TABLET | Freq: Two times a day (BID) | ORAL | 0 refills | Status: DC

## 2017-09-26 NOTE — Telephone Encounter (Signed)
Spoke with daughter Ahmia who stated,"it's been about 1.5 weeks since I've seen Mom, but she looks like she is going down hill fast. Her breathing pattern is not good. She has two procedures today at [facility], and while we were there, I was wondering if she could be seen." Per Sandi Mealy, PA, have her do labs at 3pm and [facility] [place] the plan and she was fine with it. She verbalized understanding.

## 2017-09-29 NOTE — Progress Notes (Signed)
[facility] Progress Note   Kestrel Mis [id number] Dec 30, 1945 72 y.o.  Diane Olson is managed by Dr. Eilleen Kempf  Actively treated with chemotherapy: no  Assessment: Plan:    Pneumonia of left lower lobe due to infectious organism [facility]) - Plan: sulfamethoxazole-trimethoprim (BACTRIM DS,SEPTRA DS) 800-160 MG tablet, albuterol (PROVENTIL) (2.5 MG/3ML) 0.083% nebulizer solution   Pneumonia: Patient is status post a PET scan completed today which show patchy consolidation with associated hypermetabolism peripheral to a left lung mass throughout the anterior left lower lung lobe, most compatible with postobstructive pneumonia. Based on these findings and the patient's report of a fever of 100 she has been given a prescription for Bactrim DS, 1 p.o. twice daily times 7 days.  Additionally she has been given a prescription for albuterol nebulizer solution with instructions that she could use this 4 times daily.  Please see After Visit Summary for patient specific instructions.  Future Appointments  Date Time Provider [place]  09/30/2017  3:00 PM Curcio, Roselie Awkward, NP [facility] None    No orders of the defined types were placed in this encounter.      Subjective:   Patient ID:  Diane Olson is a 72 y.o. (DOB [birth year]) female.  Chief Complaint:  Chief Complaint  Patient presents with  . Fatigue    HPI Diane Olson is a 72 year old female who is seen by Dr. Eilleen Kempf and who has a suspected lung cancer.  She is status post a PET scan today which shows a cavitary hypermetabolic 3.9 anterior left lower lobe mass compatible with a primary bronchogenic carcinoma.  Additionally noted was patchy consolidation with associated hypermetabolism peripheral to this lung mass throughout the anterior left lower lobe, most compatible with postobstructive pneumonia.  Additionally there were hypermetabolic ipsilateral Silar, AP window and subcarinal  nodal metastatic disease.  Subcarinal nodal metastasis is bulky and locally invasive with encasement of the lower trachea, carina and bilateral main stem bronchi with narrowing of the left mainstem bronchus.  No hypermetabolic extrathoracic metastatic disease was noted.  The patient presents with her daughters today.  She reports a fever of 100 with an increasing nonproductive cough, shortness of breath, and fatigue since Monday.  1 of her daughters was diagnosed with influenza and was treated with Tamiflu.  The patient did not receive Tamiflu.  Her labs returned today showing a white blood count of 17.7 which was up from 14 when checked 11 days ago.  Medications: I have reviewed the patient's current medications.  Allergies:  Allergies  Allergen Reactions  . Amoxicillin-Pot Clavulanate Swelling  . Benazepril Swelling  . Lisinopril     Past Medical History:  Diagnosis Date  . Hypertension   . Thyroid disease     Past Surgical History:  Procedure Laterality Date  . ABDOMINAL HYSTERECTOMY      No family history on file.  Social History   Socioeconomic History  . Marital status: Single    Spouse name: Not on file  . Number of children: Not on file  . Years of education: Not on file  . Highest education level: Not on file  Occupational History  . Not on file  Social Needs  . Financial resource strain: Not on file  . Food insecurity:    Worry: Not on file    Inability: Not on file  . Transportation needs:    Medical: Not on file    Non-medical: Not on file  Tobacco Use  . Smoking  status: Current Every Day Smoker    Packs/day: 0.50    Years: 60.00    Pack years: 30.00  Substance and Sexual Activity  . Alcohol use: No  . Drug use: No  . Sexual activity: Not on file  Lifestyle  . Physical activity:    Days per week: Not on file    Minutes per session: Not on file  . Stress: Not on file  Relationships  . Social connections:    Talks on phone: Not on file    Gets  together: Not on file    Attends religious service: Not on file    Active member of club or organization: Not on file    Attends meetings of clubs or organizations: Not on file    Relationship status: Not on file  . Intimate partner violence:    Fear of current or ex partner: Not on file    Emotionally abused: Not on file    Physically abused: Not on file    Forced sexual activity: Not on file  Other Topics Concern  . Not on file  Social History Narrative  . Not on file    Past Medical History, Surgical history, Social history, and Family history were reviewed and updated as appropriate.   Please see review of systems for further details on the patient's review from today.   Review of Systems:  Review of Systems  Constitutional: Positive for fatigue and fever. Negative for chills and diaphoresis.  HENT: Negative for rhinorrhea and voice change.   Respiratory: Positive for cough and shortness of breath. Negative for chest tightness and wheezing.   Cardiovascular: Negative for chest pain, palpitations and leg swelling.  Musculoskeletal: Negative for myalgias.  Neurological: Negative for headaches.    Objective:   Physical Exam:  BP 91/67 (BP Location: Left Arm, Patient Position: Sitting)   Pulse (!) 111   Temp (!) 97.5 F (36.4 C) (Oral)   Resp 18   Ht 5\' 3"  (1.6 m)   Wt 103 lb 1.6 oz (46.8 kg)   SpO2 97%   BMI 18.26 kg/m  ECOG: She  Physical Exam  Constitutional: No distress.  HENT:  Head: Normocephalic and atraumatic.  Mouth/Throat: Oropharynx is clear and moist. No oropharyngeal exudate.  Eyes: Right eye exhibits no discharge. Left eye exhibits no discharge.  Neck: Normal range of motion. Neck supple.  Cardiovascular: S1 normal and S2 normal. Tachycardia present.  Pulmonary/Chest: Effort normal. She has no wheezes.  Coarse breath sounds throughout all lung fields with increased coarse breath sounds in the left lower lobe.  Decreased percussion in that area also.   Lymphadenopathy:    She has no cervical adenopathy.  Neurological: She is alert. Coordination (The patient is ambulating with the use of a wheelchair today.) abnormal.  Skin: Skin is warm and dry. No rash noted. She is not diaphoretic. No erythema.    Lab Review:     Component Value Date/Time   NA 140 09/15/2017 1403   K 3.8 09/15/2017 1403   CL 100 09/15/2017 1403   CO2 27 09/15/2017 1403   GLUCOSE 99 09/15/2017 1403   BUN 23 09/15/2017 1403   CREATININE 0.89 09/15/2017 1403   CALCIUM 11.0 (H) 09/15/2017 1403   PROT 7.8 09/15/2017 1403   ALBUMIN 3.9 09/15/2017 1403   AST 11 09/15/2017 1403   ALT 12 09/15/2017 1403   ALKPHOS 59 09/15/2017 1403   BILITOT 0.4 09/15/2017 1403   GFRNONAA >60 09/15/2017 1403   GFRAA >  60 09/15/2017 1403       Component Value Date/Time   WBC 17.7 (H) 09/26/2017 1333   WBC 11.1 (H) 09/01/2017 1221   RBC 4.72 09/26/2017 1333   HGB 14.3 09/01/2017 1221   HCT 40.8 09/26/2017 1333   PLT 287 09/26/2017 1333   MCV 86.4 09/26/2017 1333   MCH 28.8 09/26/2017 1333   MCHC 33.3 09/26/2017 1333   RDW 14.5 09/26/2017 1333   LYMPHSABS 0.6 (L) 09/26/2017 1333   MONOABS 1.1 (H) 09/26/2017 1333   EOSABS 0.0 09/26/2017 1333   BASOSABS 0.0 09/26/2017 1333   -------------------------------  Imaging from last 24 hours (if applicable):  Radiology interpretation: Dg Chest 2 View  Result Date: 09/01/2017 CLINICAL DATA:  Two episodes of hemoptysis since yesterday. No shortness of breath, persistent cough since diagnosis with pneumonia in January 2019. Current smoker. EXAM: CHEST  2 VIEW COMPARISON:  None in PACs FINDINGS: There is abnormal soft tissue density in the left infrahilar region likely in the anterior aspect of the lower lobe. More peripherally there is subtle nodular density measuring just under 1 cm in diameter. The right lung is well-expanded. The interstitial markings are both lungs are coarse. There is no significant pleural effusion. The heart and  pulmonary vascularity are normal. There is calcification in the wall of the thoracic aorta. There is a small amount of apical pleural thickening bilaterally. The bony thorax is unremarkable. IMPRESSION: COPD or reactive airway disease. Abnormal soft tissue density in the left infrahilar region and more peripherally in the left lower lobe worrisome for malignancy. Atypical pneumonia could be present. Chest CT scanning is recommended. Thoracic aortic atherosclerosis. Electronically Signed   By: David  Martinique M.D.   On: 09/01/2017 10:16   Ct Chest W Contrast  Result Date: 09/01/2017 CLINICAL DATA:  Persistent lingering cough. Recent pneumonia. Occasional hemoptysis. Findings on radiographs worrisome for malignancy. EXAM: CT CHEST WITH CONTRAST TECHNIQUE: Multidetector CT imaging of the chest was performed during intravenous contrast administration. CONTRAST:  <75 cc ISOVUE-300 IOPAMIDOL (ISOVUE-300) INJECTION 61% COMPARISON:  Radiographs 09/01/2017.  No other comparison studies. FINDINGS: Cardiovascular: Diffuse atherosclerosis of the aorta, great vessels and coronary arteries. No acute vascular findings are evident. The heart size is normal. There is no pericardial effusion. Mediastinum/Nodes: There is a large central mediastinal mass which splays the carina and exerts mass effect on the central bronchi bilaterally. This measures approximately 5.2 x 4.3 cm on image 58/3. This mass is contiguous with subcarinal and right hilar adenopathy, the latter measuring up to 2.3 x 1.8 cm on image 65/3. Central mediastinal mass indents the trachea anteriorly and may invade it (image 49/3). There is a small hiatal hernia. The thyroid gland is not clearly visualized. Lungs/Pleura: There is no pleural effusion or pneumothorax. There is a left infrahilar mass measuring 4.1 x 3.1 cm (image 87/4). This obstructs the anteromedial basal segment of the left lower lobe bronchus and appears cavitary peripherally. There is mild patchy  airspace disease peripheral to this mass. No other suspicious pulmonary nodules. There is mild biapical scarring and mild centrilobular emphysema. Upper abdomen: No highly suspect findings within the visualized upper abdomen. There are several low-density hepatic lesions which are probably cysts. There are probable left renal cysts. No evidence of adrenal mass. Musculoskeletal/Chest wall: There is no chest wall mass or suspicious osseous finding. IMPRESSION: 1. 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 There is associated central mediastinal lymphadenopathy with mass effect on the tracheobronchial tree and possible tracheal invasion.  Tissue sampling recommended. 2. No distant metastases identified within the chest. Mild emphysema and biapical scarring. 3.  Aortic Atherosclerosis (ICD10-I70.0). 4. No highly suspect findings in the visualized upper abdomen. Electronically Signed   By: Richardean Sale M.D.   On: 09/01/2017 14:23   Mr Jeri Cos EY Contrast  Result Date: 09/26/2017 CLINICAL DATA:  Lung cancer staging.  History of lymphoma EXAM: MRI HEAD WITHOUT AND WITH CONTRAST TECHNIQUE: Multiplanar, multiecho pulse sequences of the brain and surrounding structures were obtained without and with intravenous contrast. CONTRAST:  72mL MULTIHANCE GADOBENATE DIMEGLUMINE 529 MG/ML IV SOLN COMPARISON:  None. FINDINGS: Brain: No enhancement or swelling to suggest metastatic disease. Mild chronic small vessel ischemia in the cerebral white matter. Remote lacunar infarct just above the right putamen Vascular: Normal flow voids and vascular enhancement. Skull and upper cervical spine: No noted metastatic disease Sinuses/Orbits: Negative Other: Sensitivity is diminished by motion degradation, most significantly on postcontrast axial T1 weighted imaging. IMPRESSION: 1. No evidence of metastatic disease. 2. Intermittent moderate motion degradation. Electronically Signed   By: Monte Fantasia M.D.    On: 09/26/2017 16:16   Nm Pet Image Initial (pi) Skull Base To Thigh  Result Date: 09/26/2017 CLINICAL DATA:  Initial treatment strategy for cavitary left lower lobe lung mass and thoracic adenopathy on recent chest CT. EXAM: NUCLEAR MEDICINE PET SKULL BASE TO THIGH TECHNIQUE: 5.4 mCi F-18 FDG was injected intravenously. Full-ring PET imaging was performed from the skull base to thigh after the radiotracer. CT data was obtained and used for attenuation correction and anatomic localization. Fasting blood glucose: 155 mg/dl COMPARISON:  09/01/2017 chest CT. FINDINGS: Mediastinal blood pool activity: SUV max 2.1 NECK: No hypermetabolic lymph nodes in the neck. Incidental CT findings: none CHEST: Hypermetabolic increasingly cavitary 3.9 x 3.1 cm anterior central left lower lobe lung mass with max SUV 17.0 (series 8/image 38). Patchy consolidation throughout the anterior left lower lobe peripheral to this lung mass is hypermetabolic and most compatible with postobstructive pneumonia. There is near occlusion of the left lower lobe bronchus by this mass. Hypermetabolic 1.0 cm left hilar lymph node with max SUV 5.6 (series 4/image 75). Hypermetabolic 1.1 cm AP window node with max SUV 4.6 (series 4/image 71). Intensely hypermetabolic and locally invasive bulky subcarinal 5.7 x 3.9 cm mass with max SUV 22.7 (series 4/image 64), which encases and narrows left mainstem bronchus, which also encases the carina, lower trachea and proximal right mainstem bronchus. No hypermetabolic right hilar, supraclavicular or axillary adenopathy. Incidental CT findings: Three-vessel coronary atherosclerosis. Atherosclerotic nonaneurysmal thoracic aorta. Mild centrilobular emphysema with mild diffuse bronchial wall thickening. Patchy tree-in-bud opacities throughout the bilateral upper lobes. ABDOMEN/PELVIS: No abnormal hypermetabolic activity within the liver, pancreas, adrenal glands, or spleen. No hypermetabolic lymph nodes in the  abdomen or pelvis. Incidental CT findings: Small hiatal hernia. Simple bilateral renal cysts, largest 3.1 cm in the interpolar right kidney. Atherosclerotic abdominal aorta with 3.0 cm infrarenal abdominal aortic aneurysm. Marked colonic diverticulosis, most prominent in the sigmoid colon. Hysterectomy. SKELETON: Metabolic activity in the spine is mildly heterogeneous, however there is no focal hypermetabolic activity to suggest skeletal metastasis. Incidental CT findings: none IMPRESSION: 1. Increasingly cavitary hypermetabolic 3.9 cm anterior left lower lobe lung mass, compatible with primary bronchogenic carcinoma. 2. Patchy consolidation with associated hypermetabolism peripheral to this lung mass throughout the anterior left lower lung lobe, most compatible with postobstructive pneumonia. 3. Hypermetabolic ipsilateral hilar, AP window and subcarinal nodal metastatic disease. The subcarinal nodal metastasis is bulky and locally  invasive with encasement of the lower trachea, carina and bilateral mainstem bronchi, with narrowing of the left mainstem bronchus. 4. No hypermetabolic extrathoracic metastatic disease. 5. Infrarenal 3.0 cm abdominal aortic aneurysm. Recommend followup by ultrasound in 3 years. This recommendation follows ACR consensus guidelines: White Paper of the ACR Incidental Findings Committee II on Vascular Findings. J Am Coll Radiol 2013; 10:789-794. 6. Aortic Atherosclerosis (ICD10-I70.0) and Emphysema (ICD10-J43.9). Three-vessel coronary atherosclerosis. Electronically Signed   By: Ilona Sorrel M.D.   On: 09/26/2017 14:46

## 2017-09-30 ENCOUNTER — Inpatient Hospital Stay: Payer: Medicare Other | Attending: Internal Medicine | Admitting: Oncology

## 2017-09-30 ENCOUNTER — Encounter: Payer: Self-pay | Admitting: Oncology

## 2017-09-30 VITALS — BP 93/69 | HR 68 | Temp 97.9°F | Resp 18 | Ht 63.0 in | Wt 100.6 lb

## 2017-09-30 DIAGNOSIS — Z716 Tobacco abuse counseling: Secondary | ICD-10-CM

## 2017-09-30 DIAGNOSIS — R05 Cough: Secondary | ICD-10-CM

## 2017-09-30 DIAGNOSIS — E079 Disorder of thyroid, unspecified: Secondary | ICD-10-CM | POA: Insufficient documentation

## 2017-09-30 DIAGNOSIS — R634 Abnormal weight loss: Secondary | ICD-10-CM

## 2017-09-30 DIAGNOSIS — J189 Pneumonia, unspecified organism: Secondary | ICD-10-CM

## 2017-09-30 DIAGNOSIS — Z79899 Other long term (current) drug therapy: Secondary | ICD-10-CM | POA: Insufficient documentation

## 2017-09-30 DIAGNOSIS — R918 Other nonspecific abnormal finding of lung field: Secondary | ICD-10-CM | POA: Diagnosis present

## 2017-09-30 DIAGNOSIS — Z8673 Personal history of transient ischemic attack (TIA), and cerebral infarction without residual deficits: Secondary | ICD-10-CM | POA: Insufficient documentation

## 2017-09-30 DIAGNOSIS — J439 Emphysema, unspecified: Secondary | ICD-10-CM | POA: Insufficient documentation

## 2017-09-30 DIAGNOSIS — I714 Abdominal aortic aneurysm, without rupture: Secondary | ICD-10-CM | POA: Insufficient documentation

## 2017-09-30 DIAGNOSIS — R599 Enlarged lymph nodes, unspecified: Secondary | ICD-10-CM | POA: Diagnosis not present

## 2017-09-30 DIAGNOSIS — I10 Essential (primary) hypertension: Secondary | ICD-10-CM | POA: Diagnosis not present

## 2017-09-30 DIAGNOSIS — N281 Cyst of kidney, acquired: Secondary | ICD-10-CM | POA: Diagnosis not present

## 2017-09-30 DIAGNOSIS — Z7984 Long term (current) use of oral hypoglycemic drugs: Secondary | ICD-10-CM | POA: Diagnosis not present

## 2017-09-30 DIAGNOSIS — K769 Liver disease, unspecified: Secondary | ICD-10-CM | POA: Diagnosis not present

## 2017-09-30 DIAGNOSIS — R5383 Other fatigue: Secondary | ICD-10-CM | POA: Diagnosis not present

## 2017-09-30 DIAGNOSIS — F17210 Nicotine dependence, cigarettes, uncomplicated: Secondary | ICD-10-CM | POA: Diagnosis not present

## 2017-09-30 DIAGNOSIS — R630 Anorexia: Secondary | ICD-10-CM | POA: Diagnosis not present

## 2017-09-30 DIAGNOSIS — C3490 Malignant neoplasm of unspecified part of unspecified bronchus or lung: Secondary | ICD-10-CM

## 2017-09-30 DIAGNOSIS — I700 Atherosclerosis of aorta: Secondary | ICD-10-CM | POA: Diagnosis not present

## 2017-09-30 DIAGNOSIS — Z7189 Other specified counseling: Secondary | ICD-10-CM

## 2017-09-30 DIAGNOSIS — J984 Other disorders of lung: Secondary | ICD-10-CM

## 2017-09-30 DIAGNOSIS — K449 Diaphragmatic hernia without obstruction or gangrene: Secondary | ICD-10-CM | POA: Diagnosis not present

## 2017-09-30 NOTE — Progress Notes (Signed)
[place] OFFICE PROGRESS NOTE  System, Pcp Not In No address on file  DIAGNOSIS: highly suspicious lung cancer presented with cavitary left lower lobe lung mass in addition to central mediastinal lymphadenopathy with mass-effect on the tracheobronchial tree and possible tracheal invasion.  PRIOR THERAPY: None.  CURRENT THERAPY: None.   INTERVAL HISTORY: Diane Olson 72 y.o. female returns for routine follow-up visit accompanied by her 2 daughters.  The patient is feeling fine today with no specific complaints except for ongoing cough, fatigue, and decreased appetite.  The patient was seen in her symptom management clinic at the end of last week and treated for pneumonia.  She remains on Bactrim and is using her nebulizer as needed.  She denies fevers and chills.  Denies chest pain, shortness of breath at rest, and hemoptysis.  She has shortness of breath with exertion and an ongoing cough.  Denies nausea, vomiting, constipation, diarrhea.  The patient has noticed additional weight loss.  She had a recent PET scan and MRI of the brain is here to discuss the results.  MEDICAL HISTORY: Past Medical History:  Diagnosis Date  . Hypertension   . Thyroid disease     ALLERGIES:  is allergic to amoxicillin-pot clavulanate; benazepril; and lisinopril.  MEDICATIONS:  Current Outpatient Medications  Medication Sig Dispense Refill  . albuterol (PROVENTIL) (2.5 MG/3ML) 0.083% nebulizer solution Take 3 mLs (2.5 mg total) by nebulization every 6 (six) hours as needed for wheezing or shortness of breath. 75 mL 12  . atenolol (TENORMIN) 50 MG tablet Take 50 mg by mouth daily.    Marland Kitchen levothyroxine (SYNTHROID, LEVOTHROID) 125 MCG tablet Take 125 mcg by mouth daily before breakfast.    . metFORMIN (GLUCOPHAGE) 500 MG tablet Take 500 mg by mouth daily with breakfast.    . predniSONE (DELTASONE) 20 MG tablet Take 1 tablet by mouth See admin instructions. 20 mg tablet 2 tabs PO QD X 3 days,  then 1 tab PO QD X 10 days    . sulfamethoxazole-trimethoprim (BACTRIM DS,SEPTRA DS) 800-160 MG tablet Take 1 tablet by mouth 2 (two) times daily. 14 tablet 0  . triamterene-hydrochlorothiazide (DYAZIDE) 37.5-25 MG capsule Take 1 capsule by mouth daily.    Marland Kitchen dextromethorphan-guaiFENesin (MUCINEX DM) 30-600 MG 12hr tablet Take 1 tablet by mouth 2 (two) times daily. (Patient not taking: Reported on 09/26/2017) 30 tablet 0   No current facility-administered medications for this visit.     SURGICAL HISTORY:  Past Surgical History:  Procedure Laterality Date  . ABDOMINAL HYSTERECTOMY      REVIEW OF SYSTEMS:   Review of Systems  Constitutional: Negative for chills, fever. Positive for fatigue, decreased appetite, and weight loss. HENT:   Negative for mouth sores, nosebleeds, sore throat and trouble swallowing.   Eyes: Negative for eye problems and icterus.  Respiratory: Negative for hemoptysis, shortness of breath at rest.  Positive for cough and shortness of breath with exertion.  Cardiovascular: Negative for chest pain and leg swelling.  Gastrointestinal: Negative for abdominal pain, constipation, diarrhea, nausea and vomiting.  Genitourinary: Negative for bladder incontinence, difficulty urinating, dysuria, frequency and hematuria.   Musculoskeletal: Negative for back pain, gait problem, neck pain and neck stiffness.  Skin: Negative for itching and rash.  Neurological: Negative for dizziness, extremity weakness, gait problem, headaches, light-headedness and seizures.  Hematological: Negative for adenopathy. Does not bruise/bleed easily.  Psychiatric/Behavioral: Negative for confusion, depression and sleep disturbance. The patient is not nervous/anxious.     PHYSICAL EXAMINATION:  Blood pressure 93/69, pulse 68, temperature 97.9 F (36.6 C), temperature source Oral, resp. rate 18, height 5\' 3"  (1.6 m), weight 100 lb 9.6 oz (45.6 kg), SpO2 98 %.  ECOG PERFORMANCE STATUS: 1 - Symptomatic  but completely ambulatory  Physical Exam  Constitutional: Oriented to person, place, and time. No distress.  HENT:  Head: Normocephalic and atraumatic.  Mouth/Throat: Oropharynx is clear and moist. No oropharyngeal exudate.  Eyes: Conjunctivae are normal. Right eye exhibits no discharge. Left eye exhibits no discharge. No scleral icterus.  Neck: Normal range of motion. Neck supple.  Cardiovascular: Normal rate, regular rhythm, normal heart sounds and intact distal pulses.   Pulmonary/Chest: Effort normal. No respiratory distress. No rales. Expiratory wheezes in the left upper lung. Abdominal: Soft. Bowel sounds are normal. Exhibits no distension and no mass. There is no tenderness.  Musculoskeletal: Normal range of motion. Exhibits no edema.  Lymphadenopathy:    No cervical adenopathy.  Neurological: Alert and oriented to person, place, and time. Exhibits normal muscle tone. Gait normal. Coordination normal.  Skin: Skin is warm and dry. No rash noted. Not diaphoretic. No erythema. No pallor.  Psychiatric: Mood, memory and judgment normal.  Vitals reviewed.  LABORATORY DATA: Lab Results  Component Value Date   WBC 17.7 (H) 09/26/2017   HGB 14.3 09/01/2017   HCT 40.8 09/26/2017   MCV 86.4 09/26/2017   PLT 287 09/26/2017      Chemistry      Component Value Date/Time   NA 140 09/15/2017 1403   K 3.8 09/15/2017 1403   CL 100 09/15/2017 1403   CO2 27 09/15/2017 1403   BUN 23 09/15/2017 1403   CREATININE 0.89 09/15/2017 1403      Component Value Date/Time   CALCIUM 11.0 (H) 09/15/2017 1403   ALKPHOS 59 09/15/2017 1403   AST 11 09/15/2017 1403   ALT 12 09/15/2017 1403   BILITOT 0.4 09/15/2017 1403       RADIOGRAPHIC STUDIES:  Dg Chest 2 View  Result Date: 09/01/2017 CLINICAL DATA:  Two episodes of hemoptysis since yesterday. No shortness of breath, persistent cough since diagnosis with pneumonia in January 2019. Current smoker. EXAM: CHEST  2 VIEW COMPARISON:  None in  PACs FINDINGS: There is abnormal soft tissue density in the left infrahilar region likely in the anterior aspect of the lower lobe. More peripherally there is subtle nodular density measuring just under 1 cm in diameter. The right lung is well-expanded. The interstitial markings are both lungs are coarse. There is no significant pleural effusion. The heart and pulmonary vascularity are normal. There is calcification in the wall of the thoracic aorta. There is a small amount of apical pleural thickening bilaterally. The bony thorax is unremarkable. IMPRESSION: COPD or reactive airway disease. Abnormal soft tissue density in the left infrahilar region and more peripherally in the left lower lobe worrisome for malignancy. Atypical pneumonia could be present. Chest CT scanning is recommended. Thoracic aortic atherosclerosis. Electronically Signed   By: David  Martinique M.D.   On: 09/01/2017 10:16   Ct Chest W Contrast  Result Date: 09/01/2017 CLINICAL DATA:  Persistent lingering cough. Recent pneumonia. Occasional hemoptysis. Findings on radiographs worrisome for malignancy. EXAM: CT CHEST WITH CONTRAST TECHNIQUE: Multidetector CT imaging of the chest was performed during intravenous contrast administration. CONTRAST:  <75 cc ISOVUE-300 IOPAMIDOL (ISOVUE-300) INJECTION 61% COMPARISON:  Radiographs 09/01/2017.  No other comparison studies. FINDINGS: Cardiovascular: Diffuse atherosclerosis of the aorta, great vessels and coronary arteries. No acute vascular findings are evident.  The heart size is normal. There is no pericardial effusion. Mediastinum/Nodes: There is a large central mediastinal mass which splays the carina and exerts mass effect on the central bronchi bilaterally. This measures approximately 5.2 x 4.3 cm on image 58/3. This mass is contiguous with subcarinal and right hilar adenopathy, the latter measuring up to 2.3 x 1.8 cm on image 65/3. Central mediastinal mass indents the trachea anteriorly and may  invade it (image 49/3). There is a small hiatal hernia. The thyroid gland is not clearly visualized. Lungs/Pleura: There is no pleural effusion or pneumothorax. There is a left infrahilar mass measuring 4.1 x 3.1 cm (image 87/4). This obstructs the anteromedial basal segment of the left lower lobe bronchus and appears cavitary peripherally. There is mild patchy airspace disease peripheral to this mass. No other suspicious pulmonary nodules. There is mild biapical scarring and mild centrilobular emphysema. Upper abdomen: No highly suspect findings within the visualized upper abdomen. There are several low-density hepatic lesions which are probably cysts. There are probable left renal cysts. No evidence of adrenal mass. Musculoskeletal/Chest wall: There is no chest wall mass or suspicious osseous finding. IMPRESSION: 1. 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 There is associated central mediastinal lymphadenopathy with mass effect on the tracheobronchial tree and possible tracheal invasion. Tissue sampling recommended. 2. No distant metastases identified within the chest. Mild emphysema and biapical scarring. 3.  Aortic Atherosclerosis (ICD10-I70.0). 4. No highly suspect findings in the visualized upper abdomen. Electronically Signed   By: Richardean Sale M.D.   On: 09/01/2017 14:23   Mr Jeri Cos EX Contrast  Result Date: 09/26/2017 CLINICAL DATA:  Lung cancer staging.  History of lymphoma EXAM: MRI HEAD WITHOUT AND WITH CONTRAST TECHNIQUE: Multiplanar, multiecho pulse sequences of the brain and surrounding structures were obtained without and with intravenous contrast. CONTRAST:  31mL MULTIHANCE GADOBENATE DIMEGLUMINE 529 MG/ML IV SOLN COMPARISON:  None. FINDINGS: Brain: No enhancement or swelling to suggest metastatic disease. Mild chronic small vessel ischemia in the cerebral white matter. Remote lacunar infarct just above the right putamen Vascular: Normal flow voids and  vascular enhancement. Skull and upper cervical spine: No noted metastatic disease Sinuses/Orbits: Negative Other: Sensitivity is diminished by motion degradation, most significantly on postcontrast axial T1 weighted imaging. IMPRESSION: 1. No evidence of metastatic disease. 2. Intermittent moderate motion degradation. Electronically Signed   By: Monte Fantasia M.D.   On: 09/26/2017 16:16   Nm Pet Image Initial (pi) Skull Base To Thigh  Result Date: 09/26/2017 CLINICAL DATA:  Initial treatment strategy for cavitary left lower lobe lung mass and thoracic adenopathy on recent chest CT. EXAM: NUCLEAR MEDICINE PET SKULL BASE TO THIGH TECHNIQUE: 5.4 mCi F-18 FDG was injected intravenously. Full-ring PET imaging was performed from the skull base to thigh after the radiotracer. CT data was obtained and used for attenuation correction and anatomic localization. Fasting blood glucose: 155 mg/dl COMPARISON:  09/01/2017 chest CT. FINDINGS: Mediastinal blood pool activity: SUV max 2.1 NECK: No hypermetabolic lymph nodes in the neck. Incidental CT findings: none CHEST: Hypermetabolic increasingly cavitary 3.9 x 3.1 cm anterior central left lower lobe lung mass with max SUV 17.0 (series 8/image 38). Patchy consolidation throughout the anterior left lower lobe peripheral to this lung mass is hypermetabolic and most compatible with postobstructive pneumonia. There is near occlusion of the left lower lobe bronchus by this mass. Hypermetabolic 1.0 cm left hilar lymph node with max SUV 5.6 (series 4/image 75). Hypermetabolic 1.1 cm AP window node with  max SUV 4.6 (series 4/image 71). Intensely hypermetabolic and locally invasive bulky subcarinal 5.7 x 3.9 cm mass with max SUV 22.7 (series 4/image 64), which encases and narrows left mainstem bronchus, which also encases the carina, lower trachea and proximal right mainstem bronchus. No hypermetabolic right hilar, supraclavicular or axillary adenopathy. Incidental CT findings:  Three-vessel coronary atherosclerosis. Atherosclerotic nonaneurysmal thoracic aorta. Mild centrilobular emphysema with mild diffuse bronchial wall thickening. Patchy tree-in-bud opacities throughout the bilateral upper lobes. ABDOMEN/PELVIS: No abnormal hypermetabolic activity within the liver, pancreas, adrenal glands, or spleen. No hypermetabolic lymph nodes in the abdomen or pelvis. Incidental CT findings: Small hiatal hernia. Simple bilateral renal cysts, largest 3.1 cm in the interpolar right kidney. Atherosclerotic abdominal aorta with 3.0 cm infrarenal abdominal aortic aneurysm. Marked colonic diverticulosis, most prominent in the sigmoid colon. Hysterectomy. SKELETON: Metabolic activity in the spine is mildly heterogeneous, however there is no focal hypermetabolic activity to suggest skeletal metastasis. Incidental CT findings: none IMPRESSION: 1. Increasingly cavitary hypermetabolic 3.9 cm anterior left lower lobe lung mass, compatible with primary bronchogenic carcinoma. 2. Patchy consolidation with associated hypermetabolism peripheral to this lung mass throughout the anterior left lower lung lobe, most compatible with postobstructive pneumonia. 3. Hypermetabolic ipsilateral hilar, AP window and subcarinal nodal metastatic disease. The subcarinal nodal metastasis is bulky and locally invasive with encasement of the lower trachea, carina and bilateral mainstem bronchi, with narrowing of the left mainstem bronchus. 4. No hypermetabolic extrathoracic metastatic disease. 5. Infrarenal 3.0 cm abdominal aortic aneurysm. Recommend followup by ultrasound in 3 years. This recommendation follows ACR consensus guidelines: White Paper of the ACR Incidental Findings Committee II on Vascular Findings. J Am Coll Radiol 2013; 10:789-794. 6. Aortic Atherosclerosis (ICD10-I70.0) and Emphysema (ICD10-J43.9). Three-vessel coronary atherosclerosis. Electronically Signed   By: Ilona Sorrel M.D.   On: 09/26/2017 14:46      ASSESSMENT/PLAN:  Malignant neoplasm of bronchus and lung [facility]) This is a very pleasant 72 year old white female with highly suspicious lung cancer presented with cavitary left lower lobe lung mass in addition to central mediastinal lymphadenopathy with mass-effect on the tracheobronchial tree and possible tracheal invasion. She had a recent PET scan and MRI of the brain is here to discuss the results.  The patient was seen with Dr. Julien Nordmann.  We discussed with the patient and her family that the MRI of the brain does not show any evidence of metastatic disease.  We discussed that the PET scan shows a hypermetabolic 3.9 cm anterior left lower lobe lung mass consistent with bronchogenic carcinoma.  The patient also has hypermetabolic ipsilateral hilar, AP window, and subcarinal nodal metastatic disease.  There is also subcarinal nodal metastasis which is locally invasive with encasement of the lower trachea, carina, and bilateral mainstem bronchi.  There is no evidence of disease outside of her lungs. Recommend the patient undergo a bronchoscopy to obtain a biopsy.  A referral has been made to cardiothoracic surgery.  Once we have the biopsy results, we will bring the patient back to discuss her treatment options.  Referral has been made to radiation oncology to discuss radiation to the left lower lobe lung mass and nodal metastasis.  For the patient's weight loss, or referral has been made to the dietitian.  The patient was advised to call immediately if she has any concerning symptoms in the interval. The patient voices understanding of current disease status and treatment options and is in agreement with the current care plan.  Orders Placed This Encounter  Procedures  .  Ambulatory referral to Cardiothoracic Surgery    Referral Priority:   Routine    Referral Type:   Surgical    Referral Reason:   Specialty Services Required    Referred to Provider:   Melrose Nakayama, MD     Requested Specialty:   Cardiothoracic Surgery    Number of Visits Requested:   1  . Ambulatory referral to Radiation Oncology    Referral Priority:   Routine    Referral Type:   Consultation    Referral Reason:   Specialty Services Required    Requested Specialty:   Radiation Oncology    Number of Visits Requested:   1  . Amb Referral to Nutrition and Diabetic E    Referral Priority:   Routine    Referral Type:   Consultation    Referral Reason:   Specialty Services Required    Number of Visits Requested:   1   Mikey Bussing, DNP, AGPCNP-BC, AOCNP 09/30/17   ADDENDUM: Hematology/Oncology Attending: I had a face-to-face encounter with the patient.  I recommended her care plan.  This is a very pleasant 72 years old white female with highly suspicious for stage IIIA (T2a, N2, M0) lung cancer pending tissue diagnosis.  She presented with large cavitary left lower lobe lung mass in addition to central mediastinal lymphadenopathy. The patient had a PET scan as well as MRI of the brain performed recently.  I personally and independently reviewed the scan images and discussed the results and showed the images to the patient and her daughters. Her brain MRI showed no evidence for metastatic disease to the brain. I recommended for the patient to see cardiothoracic surgery for consideration of bronchoscopy with endobronchial ultrasound and biopsy of the left lower lobe lung mass as well as the mediastinal lymph nodes for tissue diagnosis and molecular studies if consistent with non-small cell carcinoma. We will arrange for the patient to come back for follow-up visit a few days after the biopsy for more detailed discussion of her treatment options. I would also refer the patient to radiation oncology for evaluation and consideration of radiotherapy to the obstructive lymphadenopathy. She was advised to call immediately if she has any concerning symptoms in the interval.  Disclaimer: This note was  dictated with voice recognition software. Similar sounding words can inadvertently be transcribed and may be missed upon review. Eilleen Kempf, MD 10/01/17

## 2017-09-30 NOTE — Assessment & Plan Note (Signed)
This is a very pleasant 72 year old white female with highly suspicious lung cancer presented with cavitary left lower lobe lung mass in addition to central mediastinal lymphadenopathy with mass-effect on the tracheobronchial tree and possible tracheal invasion. She had a recent PET scan and MRI of the brain is here to discuss the results.  The patient was seen with Dr. Julien Nordmann.  We discussed with the patient and her family that the MRI of the brain does not show any evidence of metastatic disease.  We discussed that the PET scan shows a hypermetabolic 3.9 cm anterior left lower lobe lung mass consistent with bronchogenic carcinoma.  The patient also has hypermetabolic ipsilateral hilar, AP window, and subcarinal nodal metastatic disease.  There is also subcarinal nodal metastasis which is locally invasive with encasement of the lower trachea, carina, and bilateral mainstem bronchi.  There is no evidence of disease outside of her lungs. Recommend the patient undergo a bronchoscopy to obtain a biopsy.  A referral has been made to cardiothoracic surgery.  Once we have the biopsy results, we will bring the patient back to discuss her treatment options.  Referral has been made to radiation oncology to discuss radiation to the left lower lobe lung mass and nodal metastasis.  For the patient's weight loss, or referral has been made to the dietitian.  The patient was advised to call immediately if she has any concerning symptoms in the interval. The patient voices understanding of current disease status and treatment options and is in agreement with the current care plan.

## 2017-10-01 ENCOUNTER — Encounter: Payer: Self-pay | Admitting: *Deleted

## 2017-10-01 ENCOUNTER — Telehealth: Payer: Self-pay | Admitting: Oncology

## 2017-10-01 NOTE — Telephone Encounter (Signed)
Scheduled appt per 4/2 los. - patient is aware of appt date and time.

## 2017-10-01 NOTE — Progress Notes (Signed)
[place] Social Work  Clinical Social Work was referred by  [employer] to review and complete healthcare advance directives.  Clinical Social Worker met with patient and daughters in [place] office.  The patient designated daughter, Joelene Millin, as their primary healthcare agent and daughter, [name], as their secondary agent.  Patient also completed healthcare living will.    Clinical Social Worker notarized documents and made copies for patient/family. Clinical Social Worker will send documents to medical records to be scanned into patient's chart. Clinical Social Worker encouraged patient/family to contact with any additional questions or concerns.  Maryjean Morn, MSW, LCSW Clinical Social Worker [facility] [phone]

## 2017-10-06 NOTE — Progress Notes (Signed)
Thoracic Location of Tumor / Histology: Malignant neoplasm of bronchus and lung.  Patient presented with ongoing cough, shortness of breath at rest, fatigue, and decreased appetite.  Recently treated with Bactrim for pneumonia.  Lingering cough with hemoptysis after completing antibiotic course.  MRI 09/26/2017: No evidence of metastatic disease.  PET 3/53/2992: Hypermetabolic 3.9 cm anterior left lower lobe lung mass consistent with bronchogenic carcinoma, hypermetabolic ipsilateral hilar, Ap window, and subcarinal nodal metastatic disease.  Also subcarinal nodal metastasis which is locally invasive with encasement of the lower trachea, carina, and bilateral mainstem bronchi.  CT chest 09/01/2017: Cavitary left lower lobe mass highly suspicious for bronchogenic carcinoma.  Central mediastinal lymphadenopathy with mass effect on the tracheobronchial tree and possible tracheal invasion.  No distant metastasis identified within the chest.  Chest xray 09/01/2017: COPD or reactive airway disease.  Abnormal soft tissue density in the left infrahilar region and more peripherally in the left lower lobe worrisome for malignancy.  Biopsies  None at this time.  Tobacco/Marijuana/Snuff/ETOH use: Former smoker, quit 09/17/2017.  Past/Anticipated interventions by cardiothoracic surgery, if  Appointment scheduled with Dr. Roxan Hockey on 10/13/2017 at 3:30 pm.  Past/Anticipated interventions by medical oncology, if any:  Dr. Cruz Condon Curcio 09/30/2017 Referral to cardiothoracic surgery for bronchoscopy Referral to radiation to discuss radiation to the left lower lobe lung mass and nodal metastasis. Referral to the dietitian for weight loss.  Signs/Symptoms  Weight changes, if any: Gain 2.4 pounds  Respiratory complaints, if any: Productive cough with clear mucus with blood on occasion.  Hemoptysis, if any: Only on occasion after lying flat.  Pain issues, if any:  On occasion with breathing in.  BP  103/74 (BP Location: Left Arm, Patient Position: Sitting, Cuff Size: Normal)   Pulse (!) 122   Temp 98.8 F (37.1 C) (Oral)   Resp 20   Ht 5\' 3"  (1.6 m)   Wt 102 lb 6.4 oz (46.4 kg)   SpO2 99%   BMI 18.14 kg/m    Wt Readings from Last 3 Encounters:  10/07/17 102 lb 6.4 oz (46.4 kg)  09/30/17 100 lb 9.6 oz (45.6 kg)  09/26/17 103 lb 1.6 oz (46.8 kg)    SAFETY ISSUES:  Prior radiation? No  Pacemaker/ICD? No  Possible current pregnancy? No, abdominal hysterectomy.  Is the patient on methotrexate? No  Current Complaints / other details:

## 2017-10-07 ENCOUNTER — Other Ambulatory Visit: Payer: Self-pay

## 2017-10-07 ENCOUNTER — Ambulatory Visit
Admission: RE | Admit: 2017-10-07 | Discharge: 2017-10-07 | Disposition: A | Discharge status: Home / Self Care | Payer: Medicare Other | Source: Ambulatory Visit | Attending: Radiation Oncology | Admitting: Radiation Oncology

## 2017-10-07 ENCOUNTER — Ambulatory Visit: Payer: Medicare Other

## 2017-10-07 ENCOUNTER — Encounter: Payer: Self-pay | Admitting: Radiation Oncology

## 2017-10-07 VITALS — BP 103/74 | HR 122 | Temp 98.8°F | Resp 20 | Ht 63.0 in | Wt 102.4 lb

## 2017-10-07 DIAGNOSIS — C3432 Malignant neoplasm of lower lobe, left bronchus or lung: Secondary | ICD-10-CM | POA: Insufficient documentation

## 2017-10-07 DIAGNOSIS — C3490 Malignant neoplasm of unspecified part of unspecified bronchus or lung: Secondary | ICD-10-CM

## 2017-10-07 NOTE — Progress Notes (Signed)
Nutrition Assessment   Reason for Assessment:   Weight loss, new lung cancer  ASSESSMENT:  72 year old female with new diagnosis of lung cancer.  Followed by Dr. Julien Nordmann.  Patient met with radiation oncology this am.    Met with patient and daughter in clinic this am.  Patient reports that she had pneumonia in January and appetite has been down since then although seems to be picking up as feeling better.  Patient coughing during visit.  Reports that she ate 2 eggs yesterday with bacon and bagel, soup sometimes for lunch and dinner.  Reports ate hot dog for supper last night.  Has been drinking ensure original recently.   Nutrition Focused Physical Exam: deferred  Medications: reviewed  Labs: reviewed  Anthropometrics:   Height: 63 inches Weight: 102 lb (gained recently) UBW: 117-120 lb  In Jan 2019 BMI: 17  13% weight loss in the last 3 months, signficant   Estimated Energy Needs  Kcals: 1380-1600 calories/d Protein: 55-69 g/d Fluid: 1.6 L/d  NUTRITION DIAGNOSIS:  Malnutrition related to pneumonia, recent cancer diagnosis as evidenced by 13% weight loss in 3 months and eating < or equal to 75% of energy needs for > or equal to 1 month   MALNUTRITION DIAGNOSIS:  Patient meets criteria for severe malnutrition in chronic illness as evidenced by 13% weight loss in the last 3 months and eating < or equal to 75% of energy needs for > or equal to 1 month    INTERVENTION:   Discussed strategies to increase calories and protein to prevent weight loss Discussed oral nutrition supplements and encouraged patent to choose high calorie shake. Samples given along with coupons Contact information provided    MONITORING, EVALUATION, GOAL: weight trends, intake   NEXT VISIT: to be scheduled with treatment  Izrael Peak B. Zenia Resides, [place], [place] Registered Dietitian [phone] (pager)

## 2017-10-07 NOTE — Progress Notes (Addendum)
Radiation Oncology         [phone] ________________________________  Name: Diane Olson        MRN: [id number]  Date of Service: 10/07/2017 DOB: [birth year]  KD:XIPJAS, Pcp Not In  Maryanna Shape, NP     REFERRING PHYSICIAN: Maryanna Shape, NP   DIAGNOSIS: The encounter diagnosis was Malignant neoplasm of bronchus and lung ([place]).   HISTORY OF PRESENT ILLNESS: Diane Olson is a 72 y.o. female seen at the request of Dr. Julien Nordmann for a recent diagnosis of lung cancer.  The patient was noted to be complaining of cough, fatigue and decreased appetite, as well as hemoptysis x2.  She was seen in the emergency department, and hospital, and chest x-ray on 09/01/2017 revealed abnormal soft tissue density in the left infrahilar region.  A CT scan followed this that revealed a large central mediastinal mass exerting mass-effect on central bronchi bilaterally measuring 5.2 x 4.3 cm.  The mass is contiguous with subcarinal and right hilar adenopathy measuring up to 2.3 x 1.8 cm.  A central mediastinal mass was also felt to possibly invade the trachea.  There was a left infrahilar mass measuring 4.1 x 3.1 cm obstructing the anterior medial basal segment of the left lower lobe bronchus.  No other evidence of disease was noted in the chest, and she has undergone imaging on 09/26/2017 this revealed hypermetabolism within the left lower lobe lung mass, hypermetabolic ipsilateral or hilar, AP window and subcarinal node disease, and the subcarinal disease was felt to be locally invasive and encasing the lower trachea carina and bilateral mid mainstem bronchi with narrowing of the left mainstem bronchus.  No extrathoracic disease was noted.  A 3 cm AAA was also noted.  She is scheduled to be evaluated by Dr. Roxan Hockey on 10/13/2017, but has not undergone biopsy yet.  She also had an MRI of the brain on 09/26/2017 which was negative for disease.  She comes today to discuss options of concurrent therapy for  presumed stage III disease.   PREVIOUS RADIATION THERAPY: No   PAST MEDICAL HISTORY:  Past Medical History:  Diagnosis Date  . Hypertension   . Thyroid disease        PAST SURGICAL HISTORY: Past Surgical History:  Procedure Laterality Date  . ABDOMINAL HYSTERECTOMY       FAMILY HISTORY: History reviewed. No pertinent family history.   SOCIAL HISTORY:  reports that she quit smoking about 2 weeks ago. She has a 30.00 pack-year smoking history. She has never used smokeless tobacco. She reports that she does not drink alcohol or use drugs.  The patient is single and lives in [place].  She is accompanied by her daughter. She is now retired and used to work for [employer].    ALLERGIES: Amoxicillin-pot clavulanate; Benazepril; and Lisinopril   MEDICATIONS:  Current Outpatient Medications  Medication Sig Dispense Refill  . albuterol (PROVENTIL) (2.5 MG/3ML) 0.083% nebulizer solution Take 3 mLs (2.5 mg total) by nebulization every 6 (six) hours as needed for wheezing or shortness of breath. 75 mL 12  . levothyroxine (SYNTHROID, LEVOTHROID) 125 MCG tablet Take 125 mcg by mouth daily before breakfast.    . metFORMIN (GLUCOPHAGE) 500 MG tablet Take 500 mg by mouth daily with breakfast.    . atenolol (TENORMIN) 50 MG tablet Take 50 mg by mouth daily.    . predniSONE (DELTASONE) 20 MG tablet Take 1 tablet by mouth See admin instructions. 20 mg tablet 2 tabs PO QD X 3 days,  then 1 tab PO QD X 10 days    . sulfamethoxazole-trimethoprim (BACTRIM DS,SEPTRA DS) 800-160 MG tablet Take 1 tablet by mouth 2 (two) times daily. (Patient not taking: Reported on 10/07/2017) 14 tablet 0  . triamterene-hydrochlorothiazide (DYAZIDE) 37.5-25 MG capsule Take 1 capsule by mouth daily.     No current facility-administered medications for this encounter.      REVIEW OF SYSTEMS: On review of systems, the patient reports that she is doing well overall. She reports a 17 lb weight loss over 6 months with a  2.4 pound weight gain recently, productive cough with clear mucous and occasional blood, occasionaly hemoptysis after lying flat, and pain with inspiration.  She reports shortness of breath with stairs. She reports she quit smoking on 09/17/17.She denies any chest pain, fevers, chills, night sweats. She denies any bowel or bladder disturbances, and denies abdominal pain, nausea or vomiting. She denies any new musculoskeletal or joint aches or pains. A complete review of systems is obtained and is otherwise negative.     PHYSICAL EXAM:  Wt Readings from Last 3 Encounters:  10/07/17 102 lb 6.4 oz (46.4 kg)  09/30/17 100 lb 9.6 oz (45.6 kg)  09/26/17 103 lb 1.6 oz (46.8 kg)   Temp Readings from Last 3 Encounters:  10/07/17 98.8 F (37.1 C) (Oral)  09/30/17 97.9 F (36.6 C) (Oral)  09/26/17 (!) 97.5 F (36.4 C) (Oral)   BP Readings from Last 3 Encounters:  10/07/17 103/74  09/30/17 93/69  09/26/17 91/67   Pulse Readings from Last 3 Encounters:  10/07/17 (!) 122  09/30/17 68  09/26/17 (!) 111   Pain Assessment Pain Score: 0-No pain/10  In general this is a well appearing caucasian female in no acute distress. She is alert and oriented x4 and appropriate throughout the examination. HEENT reveals that the patient is normocephalic, atraumatic. EOMs are intact. PERRLA. Skin is intact without any evidence of gross lesions. Cardiovascular exam reveals a tachycardic rate and normal rhythm, no clicks rubs or murmurs are auscultated. Chest is clear to auscultation bilaterally. No evidence of edema of the upper extremities, and no collateral vessels are noted on the chest wall. Lymphatic assessment is performed and does not reveal any adenopathy in the cervical, supraclavicular, axillary, or inguinal chains. Abdomen has active bowel sounds in all quadrants and is intact. The abdomen is soft, non tender, non distended. Lower extremities are negative for pretibial pitting edema, deep calf tenderness,  cyanosis or clubbing.   ECOG = 1  0 - Asymptomatic (Fully active, able to carry on all predisease activities without restriction)  1 - Symptomatic but completely ambulatory (Restricted in physically strenuous activity but ambulatory and able to carry out work of a light or sedentary nature. For example, light housework, office work)  2 - Symptomatic, <50% in bed during the day (Ambulatory and capable of all self care but unable to carry out any work activities. Up and about more than 50% of waking hours)  3 - Symptomatic, >50% in bed, but not bedbound (Capable of only limited self-care, confined to bed or chair 50% or more of waking hours)  4 - Bedbound (Completely disabled. Cannot carry on any self-care. Totally confined to bed or chair)  5 - Death   Eustace Pen MM, Creech RH, Tormey DC, et al. [phone]). "Toxicity and response criteria of the [facility]". [place] Oncol. 5 (6): 649-55    LABORATORY DATA:  Lab Results  Component Value Date   WBC  17.7 (H) 09/26/2017   HGB 14.3 09/01/2017   HCT 40.8 09/26/2017   MCV 86.4 09/26/2017   PLT 287 09/26/2017   Lab Results  Component Value Date   NA 140 09/15/2017   K 3.8 09/15/2017   CL 100 09/15/2017   CO2 27 09/15/2017   Lab Results  Component Value Date   ALT 12 09/15/2017   AST 11 09/15/2017   ALKPHOS 59 09/15/2017   BILITOT 0.4 09/15/2017      RADIOGRAPHY: Mr Jeri Cos ID Contrast  Result Date: 09/26/2017 CLINICAL DATA:  Lung cancer staging.  History of lymphoma EXAM: MRI HEAD WITHOUT AND WITH CONTRAST TECHNIQUE: Multiplanar, multiecho pulse sequences of the brain and surrounding structures were obtained without and with intravenous contrast. CONTRAST:  72mL MULTIHANCE GADOBENATE DIMEGLUMINE 529 MG/ML IV SOLN COMPARISON:  None. FINDINGS: Brain: No enhancement or swelling to suggest metastatic disease. Mild chronic small vessel ischemia in the cerebral white matter. Remote lacunar infarct just above the  right putamen Vascular: Normal flow voids and vascular enhancement. Skull and upper cervical spine: No noted metastatic disease Sinuses/Orbits: Negative Other: Sensitivity is diminished by motion degradation, most significantly on postcontrast axial T1 weighted imaging. IMPRESSION: 1. No evidence of metastatic disease. 2. Intermittent moderate motion degradation. Electronically Signed   By: Monte Fantasia M.D.   On: 09/26/2017 16:16   Nm Pet Image Initial (pi) Skull Base To Thigh  Result Date: 09/26/2017 CLINICAL DATA:  Initial treatment strategy for cavitary left lower lobe lung mass and thoracic adenopathy on recent chest CT. EXAM: NUCLEAR MEDICINE PET SKULL BASE TO THIGH TECHNIQUE: 5.4 mCi F-18 FDG was injected intravenously. Full-ring PET imaging was performed from the skull base to thigh after the radiotracer. CT data was obtained and used for attenuation correction and anatomic localization. Fasting blood glucose: 155 mg/dl COMPARISON:  09/01/2017 chest CT. FINDINGS: Mediastinal blood pool activity: SUV max 2.1 NECK: No hypermetabolic lymph nodes in the neck. Incidental CT findings: none CHEST: Hypermetabolic increasingly cavitary 3.9 x 3.1 cm anterior central left lower lobe lung mass with max SUV 17.0 (series 8/image 38). Patchy consolidation throughout the anterior left lower lobe peripheral to this lung mass is hypermetabolic and most compatible with postobstructive pneumonia. There is near occlusion of the left lower lobe bronchus by this mass. Hypermetabolic 1.0 cm left hilar lymph node with max SUV 5.6 (series 4/image 75). Hypermetabolic 1.1 cm AP window node with max SUV 4.6 (series 4/image 71). Intensely hypermetabolic and locally invasive bulky subcarinal 5.7 x 3.9 cm mass with max SUV 22.7 (series 4/image 64), which encases and narrows left mainstem bronchus, which also encases the carina, lower trachea and proximal right mainstem bronchus. No hypermetabolic right hilar, supraclavicular or  axillary adenopathy. Incidental CT findings: Three-vessel coronary atherosclerosis. Atherosclerotic nonaneurysmal thoracic aorta. Mild centrilobular emphysema with mild diffuse bronchial wall thickening. Patchy tree-in-bud opacities throughout the bilateral upper lobes. ABDOMEN/PELVIS: No abnormal hypermetabolic activity within the liver, pancreas, adrenal glands, or spleen. No hypermetabolic lymph nodes in the abdomen or pelvis. Incidental CT findings: Small hiatal hernia. Simple bilateral renal cysts, largest 3.1 cm in the interpolar right kidney. Atherosclerotic abdominal aorta with 3.0 cm infrarenal abdominal aortic aneurysm. Marked colonic diverticulosis, most prominent in the sigmoid colon. Hysterectomy. SKELETON: Metabolic activity in the spine is mildly heterogeneous, however there is no focal hypermetabolic activity to suggest skeletal metastasis. Incidental CT findings: none IMPRESSION: 1. Increasingly cavitary hypermetabolic 3.9 cm anterior left lower lobe lung mass, compatible with primary bronchogenic carcinoma. 2. Patchy consolidation with  associated hypermetabolism peripheral to this lung mass throughout the anterior left lower lung lobe, most compatible with postobstructive pneumonia. 3. Hypermetabolic ipsilateral hilar, AP window and subcarinal nodal metastatic disease. The subcarinal nodal metastasis is bulky and locally invasive with encasement of the lower trachea, carina and bilateral mainstem bronchi, with narrowing of the left mainstem bronchus. 4. No hypermetabolic extrathoracic metastatic disease. 5. Infrarenal 3.0 cm abdominal aortic aneurysm. Recommend followup by ultrasound in 3 years. This recommendation follows ACR consensus guidelines: White Paper of the ACR Incidental Findings Committee II on Vascular Findings. J Am Coll Radiol 2013; 10:789-794. 6. Aortic Atherosclerosis (ICD10-I70.0) and Emphysema (ICD10-J43.9). Three-vessel coronary atherosclerosis. Electronically Signed   By: Ilona Sorrel M.D.   On: 09/26/2017 14:46       IMPRESSION/PLAN: 1. 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 Dr. Lisbeth Renshaw discusses the pathology findings and reviews the nature of lung cancer. We discussed the role of concurrent therapy and would await biopsy confirmation prior to proceeding. We discussed the risks, benefits, short, and long term effects of radiotherapy, and the patient is interested in proceeding. Dr. Lisbeth Renshaw discusses the delivery and logistics of radiotherapy and anticipates a course of 6 1/2 weeks. Written consent is obtained and placed in the chart, a copy was provided to the patient. She will simulate in the next week and will be contacted by our staff to coordinate this appointment. She will notify us if she has progressive hemoptysis, shortness of breath, or chest pain which could indicate a role for starting treatment more urgently. We otherwise anticipate starting therapy on 10/20/17. 2. Tobacco Cessation. The patient has quit on her own abruptly and is doing pretty well overall with managing this. We will follow this expectantly.  The above documentation reflects my direct findings during this shared patient visit. Please see the separate note by Dr. Lisbeth Renshaw on this date for the remainder of the patient's plan of care.    Carola Rhine, PAC  This document serves as a record of services personally performed by Kyung Rudd, MD and Shona Simpson, PA-C. It was created on their behalf by Bethann Humble, a trained medical scribe. The creation of this record is based on the scribe's personal observations and the provider's statements to them. This document has been checked and approved by the attending provider.

## 2017-10-10 ENCOUNTER — Ambulatory Visit: Payer: Medicare Other | Admitting: Radiation Oncology

## 2017-10-10 ENCOUNTER — Telehealth: Payer: Self-pay | Admitting: *Deleted

## 2017-10-10 NOTE — Telephone Encounter (Signed)
Called Diane Olson to let her know that her CT simulation has been scheduled for April 17 at 9:00 am.  She verbalized understanding and confirmed she would be here.  Will continue to follow as necessary.  Cori Razor, RN

## 2017-10-13 ENCOUNTER — Encounter: Payer: Self-pay | Admitting: Thoracic Surgery (Cardiothoracic Vascular Surgery)

## 2017-10-13 ENCOUNTER — Other Ambulatory Visit: Payer: Self-pay | Admitting: *Deleted

## 2017-10-13 ENCOUNTER — Institutional Professional Consult (permissible substitution) (INDEPENDENT_AMBULATORY_CARE_PROVIDER_SITE_OTHER): Payer: Medicare Other | Admitting: Thoracic Surgery (Cardiothoracic Vascular Surgery)

## 2017-10-13 ENCOUNTER — Other Ambulatory Visit: Payer: Self-pay

## 2017-10-13 VITALS — BP 115/85 | HR 124 | Resp 18 | Ht 63.0 in | Wt 104.8 lb

## 2017-10-13 DIAGNOSIS — C3490 Malignant neoplasm of unspecified part of unspecified bronchus or lung: Secondary | ICD-10-CM

## 2017-10-13 DIAGNOSIS — R590 Localized enlarged lymph nodes: Secondary | ICD-10-CM

## 2017-10-13 DIAGNOSIS — J984 Other disorders of lung: Secondary | ICD-10-CM

## 2017-10-13 DIAGNOSIS — R918 Other nonspecific abnormal finding of lung field: Secondary | ICD-10-CM

## 2017-10-13 NOTE — H&P (View-Only) (Signed)
PCP is System, Pcp Not In Referring Provider is Curcio, Roselie Awkward, NP  Chief Complaint  Patient presents with  . Lung Cancer    discuss biopsy, PET 09/26/2017, MR Brain 09/26/2017, Chest CT 09/01/2017    HPI: Diane Olson is sent for consideration for biopsy.  Diane Olson is a 72 year old woman with a history of tobacco abuse, hypertension, and thyroid disease.  She smoked about half a pack a day for 60 years before quitting about a month ago.  She was treated for pneumonia in January.  She completed her course of antibiotics but had a lingering cough.  She did not think too much about this initially but developed hemoptysis.  She went to the emergency room.  A CT of the chest showed a left lower lobe mass with bulky hilar and mediastinal adenopathy.  She was referred to Dr. Julien Nordmann.  He did a PET/CT and MRI of the brain.  The MR showed no evidence of brain metastases.  PET showed the left lower lobe cavitary mass hilar paratracheal and subcarinal lymph nodes were markedly hypermetabolic.  She has had a poor appetite.  She lost about 17 pounds over 3 months although she has gained back about 4 pounds over the past week.  She has a constant nagging cough.  It is often productive.  She still sees occasional blood.  She also has had a lot of wheezing.  She has not had any chest pain, pressure, or tightness.  She denies any headaches or visual changes.  No new bone or joint pain.  Zubrod Score: At the time of surgery this patient's most appropriate activity status/level should be described as: []     0    Normal activity, no symptoms [x]     1    Restricted in physical strenuous activity but ambulatory, able to do out light work []     2    Ambulatory and capable of self care, unable to do work activities, up and about >50 % of waking hours                              []     3    Only limited self care, in bed greater than 50% of waking hours []     4    Completely disabled, no self care, confined to  bed or chair []     5    Moribund  Past Medical History:  Diagnosis Date  . Hypertension   . Thyroid disease     Past Surgical History:  Procedure Laterality Date  . ABDOMINAL HYSTERECTOMY      No family history on file.  Social History Social History   Tobacco Use  . Smoking status: Former Smoker    Packs/day: 0.50    Years: 60.00    Pack years: 30.00    Last attempt to quit: 09/17/2017    Years since quitting: 0.0  . Smokeless tobacco: Never Used  Substance Use Topics  . Alcohol use: No  . Drug use: No    Current Outpatient Medications  Medication Sig Dispense Refill  . albuterol (PROVENTIL) (2.5 MG/3ML) 0.083% nebulizer solution Take 3 mLs (2.5 mg total) by nebulization every 6 (six) hours as needed for wheezing or shortness of breath. 75 mL 12  . levothyroxine (SYNTHROID, LEVOTHROID) 125 MCG tablet Take 125 mcg by mouth daily before breakfast.     No current facility-administered medications for this visit.  Allergies  Allergen Reactions  . Amoxicillin-Pot Clavulanate Swelling  . Benazepril Swelling  . Lisinopril     Review of Systems  Constitutional: Positive for appetite change, fatigue and unexpected weight change (Lost 17 pounds in 3 months).  HENT: Positive for voice change (Hoarse). Negative for trouble swallowing.   Eyes: Negative for visual disturbance.  Respiratory: Positive for cough (Hemoptysis), shortness of breath and wheezing.   Cardiovascular: Negative for chest pain and leg swelling.  Gastrointestinal: Negative for abdominal distention and abdominal pain.  Genitourinary: Negative for difficulty urinating and dysuria.  Musculoskeletal: Negative for back pain and joint swelling.  Neurological: Negative for seizures and headaches.  Hematological: Negative for adenopathy. Does not bruise/bleed easily.  All other systems reviewed and are negative.   BP 115/85 (BP Location: Left Arm, Patient Position: Sitting, Cuff Size: Small)   Pulse (!)  124   Resp 18   Ht 5\' 3"  (1.6 m)   Wt 104 lb 12.8 oz (47.5 kg)   SpO2 98% Comment: RA  BMI 18.56 kg/m  Physical Exam  Constitutional: She is oriented to person, place, and time. She appears well-developed. No distress.  Thin  HENT:  Head: Normocephalic and atraumatic.  Mouth/Throat: No oropharyngeal exudate.  Eyes: Conjunctivae and EOM are normal. No scleral icterus.  Neck: Neck supple. No thyromegaly present.  Pulmonary/Chest: Effort normal. No respiratory distress. She has wheezes. She has no rales.  Frequent cough  Abdominal: Soft. She exhibits no distension. There is no tenderness.  Musculoskeletal: She exhibits no edema or deformity.  Lymphadenopathy:    She has no cervical adenopathy.  Neurological: She is alert and oriented to person, place, and time. No cranial nerve deficit. She exhibits normal muscle tone.  Skin: Skin is warm and dry.  Vitals reviewed.    Diagnostic Tests: NUCLEAR MEDICINE PET SKULL BASE TO THIGH  TECHNIQUE: 5.4 mCi F-18 FDG was injected intravenously. Full-ring PET imaging was performed from the skull base to thigh after the radiotracer. CT data was obtained and used for attenuation correction and anatomic localization.  Fasting blood glucose: 155 mg/dl  COMPARISON:  09/01/2017 chest CT.  FINDINGS: Mediastinal blood pool activity: SUV max 2.1  NECK: No hypermetabolic lymph nodes in the neck.  Incidental CT findings: none  CHEST:  Hypermetabolic increasingly cavitary 3.9 x 3.1 cm anterior central left lower lobe lung mass with max SUV 17.0 (series 8/image 38). Patchy consolidation throughout the anterior left lower lobe peripheral to this lung mass is hypermetabolic and most compatible with postobstructive pneumonia. There is near occlusion of the left lower lobe bronchus by this mass.  Hypermetabolic 1.0 cm left hilar lymph node with max SUV 5.6 (series 4/image 75).  Hypermetabolic 1.1 cm AP window node with max SUV 4.6  (series 4/image 71).  Intensely hypermetabolic and locally invasive bulky subcarinal 5.7 x 3.9 cm mass with max SUV 22.7 (series 4/image 64), which encases and narrows left mainstem bronchus, which also encases the carina, lower trachea and proximal right mainstem bronchus.  No hypermetabolic right hilar, supraclavicular or axillary adenopathy.  Incidental CT findings: Three-vessel coronary atherosclerosis. Atherosclerotic nonaneurysmal thoracic aorta. Mild centrilobular emphysema with mild diffuse bronchial wall thickening. Patchy tree-in-bud opacities throughout the bilateral upper lobes.  ABDOMEN/PELVIS: No abnormal hypermetabolic activity within the liver, pancreas, adrenal glands, or spleen. No hypermetabolic lymph nodes in the abdomen or pelvis.  Incidental CT findings: Small hiatal hernia. Simple bilateral renal cysts, largest 3.1 cm in the interpolar right kidney. Atherosclerotic abdominal aorta with 3.0 cm infrarenal  abdominal aortic aneurysm. Marked colonic diverticulosis, most prominent in the sigmoid colon. Hysterectomy.  SKELETON: Metabolic activity in the spine is mildly heterogeneous, however there is no focal hypermetabolic activity to suggest skeletal metastasis.  Incidental CT findings: none  IMPRESSION: 1. Increasingly cavitary hypermetabolic 3.9 cm anterior left lower lobe lung mass, compatible with primary bronchogenic carcinoma. 2. Patchy consolidation with associated hypermetabolism peripheral to this lung mass throughout the anterior left lower lung lobe, most compatible with postobstructive pneumonia. 3. Hypermetabolic ipsilateral hilar, AP window and subcarinal nodal metastatic disease. The subcarinal nodal metastasis is bulky and locally invasive with encasement of the lower trachea, carina and bilateral mainstem bronchi, with narrowing of the left mainstem bronchus. 4. No hypermetabolic extrathoracic metastatic disease. 5. Infrarenal 3.0  cm abdominal aortic aneurysm. Recommend followup by ultrasound in 3 years. This recommendation follows ACR consensus guidelines: White Paper of the ACR Incidental Findings Committee II on Vascular Findings. J Am Coll Radiol 2013; 10:789-794. 6. Aortic Atherosclerosis (ICD10-I70.0) and Emphysema (ICD10-J43.9). Three-vessel coronary atherosclerosis.   Electronically Signed   By: Ilona Sorrel M.D.   On: 09/26/2017 14:46 I personally reviewed the PET/CT images and concur with the findings noted above  Impression: Diane Olson is a 72 year old woman with a history of tobacco abuse who presented with a persistent cough and hemoptysis.  She has been found to have a 3.9 cm left lower lobe lung mass with hilar aortopulmonary window and subcarinal adenopathy.  The subcarinal adenopathy in particular is very bulky and may actually be fading the trachea.  There is some narrowing of the left mainstem bronchus.  She has no evidence of distant metastatic disease.  Findings are consistent with a stage IIIA (T2, N2) lung cancer.  She needs a tissue diagnosis to guide therapy.  I recommended to her that we proceed with bronchoscopy and endobronchial ultrasound for diagnostic purposes.  She understands this is diagnostic and not therapeutic.  I informed her of the indications, risks, benefits, and alternatives.  She understands the risks include, but are not limited to death, MI, DVT, PE, bleeding, stroke, pneumothorax, as well as possibility of other unforeseeable complications.  She accepts the risks and wishes to proceed.  Plan: Bronchoscopy and endobronchial ultrasound on Thursday, 10/23/2017.  Melrose Nakayama, MD [facility] [phone]

## 2017-10-13 NOTE — Progress Notes (Signed)
PCP is System, Pcp Not In Referring Provider is Curcio, Roselie Awkward, NP  Chief Complaint  Patient presents with  . Lung Cancer    discuss biopsy, PET 09/26/2017, MR Brain 09/26/2017, Chest CT 09/01/2017    HPI: Diane Olson is sent for consideration for biopsy.  Diane Olson is a 72 year old woman with a history of tobacco abuse, hypertension, and thyroid disease.  She smoked about half a pack a day for 60 years before quitting about a month ago.  She was treated for pneumonia in January.  She completed her course of antibiotics but had a lingering cough.  She did not think too much about this initially but developed hemoptysis.  She went to the emergency room.  A CT of the chest showed a left lower lobe mass with bulky hilar and mediastinal adenopathy.  She was referred to Dr. Julien Nordmann.  He did a PET/CT and MRI of the brain.  The MR showed no evidence of brain metastases.  PET showed the left lower lobe cavitary mass hilar paratracheal and subcarinal lymph nodes were markedly hypermetabolic.  She has had a poor appetite.  She lost about 17 pounds over 3 months although she has gained back about 4 pounds over the past week.  She has a constant nagging cough.  It is often productive.  She still sees occasional blood.  She also has had a lot of wheezing.  She has not had any chest pain, pressure, or tightness.  She denies any headaches or visual changes.  No new bone or joint pain.  Zubrod Score: At the time of surgery this patient's most appropriate activity status/level should be described as: []     0    Normal activity, no symptoms [x]     1    Restricted in physical strenuous activity but ambulatory, able to do out light work []     2    Ambulatory and capable of self care, unable to do work activities, up and about >50 % of waking hours                              []     3    Only limited self care, in bed greater than 50% of waking hours []     4    Completely disabled, no self care, confined to  bed or chair []     5    Moribund  Past Medical History:  Diagnosis Date  . Hypertension   . Thyroid disease     Past Surgical History:  Procedure Laterality Date  . ABDOMINAL HYSTERECTOMY      No family history on file.  Social History Social History   Tobacco Use  . Smoking status: Former Smoker    Packs/day: 0.50    Years: 60.00    Pack years: 30.00    Last attempt to quit: 09/17/2017    Years since quitting: 0.0  . Smokeless tobacco: Never Used  Substance Use Topics  . Alcohol use: No  . Drug use: No    Current Outpatient Medications  Medication Sig Dispense Refill  . albuterol (PROVENTIL) (2.5 MG/3ML) 0.083% nebulizer solution Take 3 mLs (2.5 mg total) by nebulization every 6 (six) hours as needed for wheezing or shortness of breath. 75 mL 12  . levothyroxine (SYNTHROID, LEVOTHROID) 125 MCG tablet Take 125 mcg by mouth daily before breakfast.     No current facility-administered medications for this visit.  Allergies  Allergen Reactions  . Amoxicillin-Pot Clavulanate Swelling  . Benazepril Swelling  . Lisinopril     Review of Systems  Constitutional: Positive for appetite change, fatigue and unexpected weight change (Lost 17 pounds in 3 months).  HENT: Positive for voice change (Hoarse). Negative for trouble swallowing.   Eyes: Negative for visual disturbance.  Respiratory: Positive for cough (Hemoptysis), shortness of breath and wheezing.   Cardiovascular: Negative for chest pain and leg swelling.  Gastrointestinal: Negative for abdominal distention and abdominal pain.  Genitourinary: Negative for difficulty urinating and dysuria.  Musculoskeletal: Negative for back pain and joint swelling.  Neurological: Negative for seizures and headaches.  Hematological: Negative for adenopathy. Does not bruise/bleed easily.  All other systems reviewed and are negative.   BP 115/85 (BP Location: Left Arm, Patient Position: Sitting, Cuff Size: Small)   Pulse (!)  124   Resp 18   Ht 5\' 3"  (1.6 m)   Wt 104 lb 12.8 oz (47.5 kg)   SpO2 98% Comment: RA  BMI 18.56 kg/m  Physical Exam  Constitutional: She is oriented to person, place, and time. She appears well-developed. No distress.  Thin  HENT:  Head: Normocephalic and atraumatic.  Mouth/Throat: No oropharyngeal exudate.  Eyes: Conjunctivae and EOM are normal. No scleral icterus.  Neck: Neck supple. No thyromegaly present.  Pulmonary/Chest: Effort normal. No respiratory distress. She has wheezes. She has no rales.  Frequent cough  Abdominal: Soft. She exhibits no distension. There is no tenderness.  Musculoskeletal: She exhibits no edema or deformity.  Lymphadenopathy:    She has no cervical adenopathy.  Neurological: She is alert and oriented to person, place, and time. No cranial nerve deficit. She exhibits normal muscle tone.  Skin: Skin is warm and dry.  Vitals reviewed.    Diagnostic Tests: NUCLEAR MEDICINE PET SKULL BASE TO THIGH  TECHNIQUE: 5.4 mCi F-18 FDG was injected intravenously. Full-ring PET imaging was performed from the skull base to thigh after the radiotracer. CT data was obtained and used for attenuation correction and anatomic localization.  Fasting blood glucose: 155 mg/dl  COMPARISON:  09/01/2017 chest CT.  FINDINGS: Mediastinal blood pool activity: SUV max 2.1  NECK: No hypermetabolic lymph nodes in the neck.  Incidental CT findings: none  CHEST:  Hypermetabolic increasingly cavitary 3.9 x 3.1 cm anterior central left lower lobe lung mass with max SUV 17.0 (series 8/image 38). Patchy consolidation throughout the anterior left lower lobe peripheral to this lung mass is hypermetabolic and most compatible with postobstructive pneumonia. There is near occlusion of the left lower lobe bronchus by this mass.  Hypermetabolic 1.0 cm left hilar lymph node with max SUV 5.6 (series 4/image 75).  Hypermetabolic 1.1 cm AP window node with max SUV 4.6  (series 4/image 71).  Intensely hypermetabolic and locally invasive bulky subcarinal 5.7 x 3.9 cm mass with max SUV 22.7 (series 4/image 64), which encases and narrows left mainstem bronchus, which also encases the carina, lower trachea and proximal right mainstem bronchus.  No hypermetabolic right hilar, supraclavicular or axillary adenopathy.  Incidental CT findings: Three-vessel coronary atherosclerosis. Atherosclerotic nonaneurysmal thoracic aorta. Mild centrilobular emphysema with mild diffuse bronchial wall thickening. Patchy tree-in-bud opacities throughout the bilateral upper lobes.  ABDOMEN/PELVIS: No abnormal hypermetabolic activity within the liver, pancreas, adrenal glands, or spleen. No hypermetabolic lymph nodes in the abdomen or pelvis.  Incidental CT findings: Small hiatal hernia. Simple bilateral renal cysts, largest 3.1 cm in the interpolar right kidney. Atherosclerotic abdominal aorta with 3.0 cm infrarenal  abdominal aortic aneurysm. Marked colonic diverticulosis, most prominent in the sigmoid colon. Hysterectomy.  SKELETON: Metabolic activity in the spine is mildly heterogeneous, however there is no focal hypermetabolic activity to suggest skeletal metastasis.  Incidental CT findings: none  IMPRESSION: 1. Increasingly cavitary hypermetabolic 3.9 cm anterior left lower lobe lung mass, compatible with primary bronchogenic carcinoma. 2. Patchy consolidation with associated hypermetabolism peripheral to this lung mass throughout the anterior left lower lung lobe, most compatible with postobstructive pneumonia. 3. Hypermetabolic ipsilateral hilar, AP window and subcarinal nodal metastatic disease. The subcarinal nodal metastasis is bulky and locally invasive with encasement of the lower trachea, carina and bilateral mainstem bronchi, with narrowing of the left mainstem bronchus. 4. No hypermetabolic extrathoracic metastatic disease. 5. Infrarenal 3.0  cm abdominal aortic aneurysm. Recommend followup by ultrasound in 3 years. This recommendation follows ACR consensus guidelines: White Paper of the ACR Incidental Findings Committee II on Vascular Findings. J Am Coll Radiol 2013; 10:789-794. 6. Aortic Atherosclerosis (ICD10-I70.0) and Emphysema (ICD10-J43.9). Three-vessel coronary atherosclerosis.   Electronically Signed   By: Ilona Sorrel M.D.   On: 09/26/2017 14:46 I personally reviewed the PET/CT images and concur with the findings noted above  Impression: Diane Olson is a 72 year old woman with a history of tobacco abuse who presented with a persistent cough and hemoptysis.  She has been found to have a 3.9 cm left lower lobe lung mass with hilar aortopulmonary window and subcarinal adenopathy.  The subcarinal adenopathy in particular is very bulky and may actually be fading the trachea.  There is some narrowing of the left mainstem bronchus.  She has no evidence of distant metastatic disease.  Findings are consistent with a stage IIIA (T2, N2) lung cancer.  She needs a tissue diagnosis to guide therapy.  I recommended to her that we proceed with bronchoscopy and endobronchial ultrasound for diagnostic purposes.  She understands this is diagnostic and not therapeutic.  I informed her of the indications, risks, benefits, and alternatives.  She understands the risks include, but are not limited to death, MI, DVT, PE, bleeding, stroke, pneumothorax, as well as possibility of other unforeseeable complications.  She accepts the risks and wishes to proceed.  Plan: Bronchoscopy and endobronchial ultrasound on Thursday, 10/18/2017.  Melrose Nakayama, MD [facility] [phone]

## 2017-10-15 ENCOUNTER — Other Ambulatory Visit: Payer: Self-pay

## 2017-10-15 ENCOUNTER — Encounter (HOSPITAL_COMMUNITY): Payer: Self-pay | Admitting: *Deleted

## 2017-10-15 ENCOUNTER — Ambulatory Visit
Admission: RE | Admit: 2017-10-15 | Discharge: 2017-10-15 | Disposition: A | Discharge status: Home / Self Care | Payer: Medicare Other | Source: Ambulatory Visit | Attending: Radiation Oncology | Admitting: Radiation Oncology

## 2017-10-15 DIAGNOSIS — C3432 Malignant neoplasm of lower lobe, left bronchus or lung: Secondary | ICD-10-CM

## 2017-10-15 NOTE — Progress Notes (Signed)
Spoke with pt for pre-op call. Pt denies cardiac history. States she has been treated for HTN in the past, but since losing weight she has not had to take medications. Pt states she is also a diabetic, not sure what her last A1C was but knows it was low. She stopped her Metformin a week ago. Has never checked her blood sugar at home.

## 2017-10-15 NOTE — Progress Notes (Signed)
I have requested A1C result from [facility].

## 2017-10-16 ENCOUNTER — Inpatient Hospital Stay (HOSPITAL_COMMUNITY): Payer: Medicare Other

## 2017-10-16 ENCOUNTER — Ambulatory Visit (HOSPITAL_COMMUNITY): Payer: Medicare Other

## 2017-10-16 ENCOUNTER — Encounter (HOSPITAL_COMMUNITY)
Admission: RE | Disposition: E | Payer: Self-pay | Source: Ambulatory Visit | Attending: Thoracic Surgery (Cardiothoracic Vascular Surgery)

## 2017-10-16 ENCOUNTER — Encounter (HOSPITAL_COMMUNITY): Payer: Self-pay | Admitting: *Deleted

## 2017-10-16 ENCOUNTER — Ambulatory Visit (HOSPITAL_COMMUNITY): Payer: Medicare Other | Admitting: Certified Registered"

## 2017-10-16 ENCOUNTER — Inpatient Hospital Stay (HOSPITAL_COMMUNITY)
Admission: RE | Admit: 2017-10-16 | Discharge: 2017-10-29 | DRG: 208 | Disposition: E | Payer: Medicare Other | Source: Ambulatory Visit | Attending: Thoracic Surgery (Cardiothoracic Vascular Surgery) | Admitting: Thoracic Surgery (Cardiothoracic Vascular Surgery)

## 2017-10-16 DIAGNOSIS — C3432 Malignant neoplasm of lower lobe, left bronchus or lung: Secondary | ICD-10-CM | POA: Diagnosis not present

## 2017-10-16 DIAGNOSIS — Z87891 Personal history of nicotine dependence: Secondary | ICD-10-CM

## 2017-10-16 DIAGNOSIS — J9561 Intraoperative hemorrhage and hematoma of a respiratory system organ or structure complicating a respiratory system procedure: Secondary | ICD-10-CM | POA: Diagnosis not present

## 2017-10-16 DIAGNOSIS — I9589 Other hypotension: Secondary | ICD-10-CM | POA: Diagnosis not present

## 2017-10-16 DIAGNOSIS — Z9889 Other specified postprocedural states: Secondary | ICD-10-CM

## 2017-10-16 DIAGNOSIS — E039 Hypothyroidism, unspecified: Secondary | ICD-10-CM | POA: Diagnosis present

## 2017-10-16 DIAGNOSIS — Z90710 Acquired absence of both cervix and uterus: Secondary | ICD-10-CM

## 2017-10-16 DIAGNOSIS — G931 Anoxic brain damage, not elsewhere classified: Secondary | ICD-10-CM | POA: Diagnosis present

## 2017-10-16 DIAGNOSIS — R918 Other nonspecific abnormal finding of lung field: Secondary | ICD-10-CM

## 2017-10-16 DIAGNOSIS — I639 Cerebral infarction, unspecified: Secondary | ICD-10-CM | POA: Diagnosis not present

## 2017-10-16 DIAGNOSIS — G936 Cerebral edema: Secondary | ICD-10-CM | POA: Diagnosis not present

## 2017-10-16 DIAGNOSIS — G253 Myoclonus: Secondary | ICD-10-CM | POA: Diagnosis not present

## 2017-10-16 DIAGNOSIS — Z79890 Hormone replacement therapy (postmenopausal): Secondary | ICD-10-CM | POA: Diagnosis not present

## 2017-10-16 DIAGNOSIS — R590 Localized enlarged lymph nodes: Secondary | ICD-10-CM

## 2017-10-16 DIAGNOSIS — Y92234 Operating room of hospital as the place of occurrence of the external cause: Secondary | ICD-10-CM | POA: Diagnosis not present

## 2017-10-16 DIAGNOSIS — Z66 Do not resuscitate: Secondary | ICD-10-CM | POA: Diagnosis present

## 2017-10-16 DIAGNOSIS — Z801 Family history of malignant neoplasm of trachea, bronchus and lung: Secondary | ICD-10-CM | POA: Diagnosis not present

## 2017-10-16 DIAGNOSIS — R0602 Shortness of breath: Secondary | ICD-10-CM | POA: Diagnosis not present

## 2017-10-16 DIAGNOSIS — Z515 Encounter for palliative care: Secondary | ICD-10-CM | POA: Diagnosis present

## 2017-10-16 DIAGNOSIS — C3402 Malignant neoplasm of left main bronchus: Principal | ICD-10-CM | POA: Diagnosis present

## 2017-10-16 DIAGNOSIS — I471 Supraventricular tachycardia: Secondary | ICD-10-CM | POA: Diagnosis not present

## 2017-10-16 DIAGNOSIS — E119 Type 2 diabetes mellitus without complications: Secondary | ICD-10-CM | POA: Diagnosis present

## 2017-10-16 DIAGNOSIS — Y848 Other medical procedures as the cause of abnormal reaction of the patient, or of later complication, without mention of misadventure at the time of the procedure: Secondary | ICD-10-CM | POA: Diagnosis not present

## 2017-10-16 DIAGNOSIS — Z8701 Personal history of pneumonia (recurrent): Secondary | ICD-10-CM

## 2017-10-16 DIAGNOSIS — I10 Essential (primary) hypertension: Secondary | ICD-10-CM | POA: Diagnosis present

## 2017-10-16 DIAGNOSIS — R569 Unspecified convulsions: Secondary | ICD-10-CM | POA: Diagnosis not present

## 2017-10-16 DIAGNOSIS — Z79899 Other long term (current) drug therapy: Secondary | ICD-10-CM | POA: Diagnosis not present

## 2017-10-16 DIAGNOSIS — Z7984 Long term (current) use of oral hypoglycemic drugs: Secondary | ICD-10-CM

## 2017-10-16 DIAGNOSIS — J9601 Acute respiratory failure with hypoxia: Secondary | ICD-10-CM | POA: Diagnosis present

## 2017-10-16 DIAGNOSIS — J9690 Respiratory failure, unspecified, unspecified whether with hypoxia or hypercapnia: Secondary | ICD-10-CM

## 2017-10-16 DIAGNOSIS — Z880 Allergy status to penicillin: Secondary | ICD-10-CM

## 2017-10-16 DIAGNOSIS — G935 Compression of brain: Secondary | ICD-10-CM | POA: Diagnosis present

## 2017-10-16 HISTORY — PX: VIDEO BRONCHOSCOPY: SHX5072

## 2017-10-16 HISTORY — DX: Anemia, unspecified: D64.9

## 2017-10-16 HISTORY — DX: Adverse effect of unspecified anesthetic, initial encounter: T41.45XA

## 2017-10-16 HISTORY — DX: Hypothyroidism, unspecified: E03.9

## 2017-10-16 HISTORY — DX: Pneumonia, unspecified organism: J18.9

## 2017-10-16 HISTORY — DX: Personal history of urinary calculi: Z87.442

## 2017-10-16 HISTORY — DX: Other complications of anesthesia, initial encounter: T88.59XA

## 2017-10-16 HISTORY — PX: RIGID BRONCHOSCOPY: SHX5069

## 2017-10-16 HISTORY — DX: Type 2 diabetes mellitus without complications: E11.9

## 2017-10-16 HISTORY — DX: Chronic obstructive pulmonary disease, unspecified: J44.9

## 2017-10-16 LAB — COMPREHENSIVE METABOLIC PANEL
ALBUMIN: 2.5 g/dL — AB (ref 3.5–5.0)
ALT: 38 U/L (ref 14–54)
ALT: 35 U/L (ref 14–54)
AST: 29 U/L (ref 15–41)
AST: 45 U/L — ABNORMAL HIGH (ref 15–41)
Albumin: 2.7 g/dL — ABNORMAL LOW (ref 3.5–5.0)
Alkaline Phosphatase: 70 U/L (ref 38–126)
Alkaline Phosphatase: 64 U/L (ref 38–126)
Anion gap: 12 (ref 5–15)
Anion gap: 12 (ref 5–15)
BILIRUBIN TOTAL: 0.7 mg/dL (ref 0.3–1.2)
BUN: 13 mg/dL (ref 6–20)
BUN: 12 mg/dL (ref 6–20)
CHLORIDE: 102 mmol/L (ref 101–111)
CHLORIDE: 102 mmol/L (ref 101–111)
CO2: 25 mmol/L (ref 22–32)
CO2: 23 mmol/L (ref 22–32)
Calcium: 9.7 mg/dL (ref 8.9–10.3)
Calcium: 8.7 mg/dL — ABNORMAL LOW (ref 8.9–10.3)
Creatinine, Ser: 0.67 mg/dL (ref 0.44–1.00)
Creatinine, Ser: 0.74 mg/dL (ref 0.44–1.00)
GFR calc Af Amer: >60 mL/min (ref >60)
GFR calc non Af Amer: >60 mL/min (ref >60)
GLUCOSE: 109 mg/dL — AB (ref 65–99)
Glucose, Bld: 203 mg/dL — ABNORMAL HIGH (ref 65–99)
POTASSIUM: 3.7 mmol/L (ref 3.5–5.1)
POTASSIUM: 3.9 mmol/L (ref 3.5–5.1)
Sodium: 139 mmol/L (ref 135–145)
Sodium: 137 mmol/L (ref 135–145)
Total Bilirubin: 1 mg/dL (ref 0.3–1.2)
Total Protein: 6.7 g/dL (ref 6.5–8.1)
Total Protein: 6 g/dL — ABNORMAL LOW (ref 6.5–8.1)

## 2017-10-16 LAB — POCT I-STAT 3, ART BLOOD GAS (G3+)
Acid-Base Excess: 2 mmol/L (ref 0.0–2.0)
Bicarbonate: 27.9 mmol/L (ref 20.0–28.0)
O2 SAT: 100 %
PCO2 ART: 43.9 mmHg (ref 32.0–48.0)
Patient temperature: 34.2
TCO2: 29 mmol/L (ref 22–32)
pH, Arterial: 7.398 (ref 7.350–7.450)
pO2, Arterial: 295 mmHg — ABNORMAL HIGH (ref 83.0–108.0)

## 2017-10-16 LAB — CBC
HCT: 30.5 % — ABNORMAL LOW (ref 36.0–46.0)
HEMATOCRIT: 33.7 % — AB (ref 36.0–46.0)
Hemoglobin: 10.8 g/dL — ABNORMAL LOW (ref 12.0–15.0)
Hemoglobin: 9.5 g/dL — ABNORMAL LOW (ref 12.0–15.0)
MCH: 28.5 pg (ref 26.0–34.0)
MCH: 27.6 pg (ref 26.0–34.0)
MCHC: 32 g/dL (ref 30.0–36.0)
MCHC: 31.1 g/dL (ref 30.0–36.0)
MCV: 88.9 fL (ref 78.0–100.0)
MCV: 88.7 fL (ref 78.0–100.0)
PLATELETS: 362 10*3/uL (ref 150–400)
PLATELETS: 301 10*3/uL (ref 150–400)
RBC: 3.79 MIL/uL — ABNORMAL LOW (ref 3.87–5.11)
RBC: 3.44 MIL/uL — ABNORMAL LOW (ref 3.87–5.11)
RDW: 14.8 % (ref 11.5–15.5)
RDW: 14.7 % (ref 11.5–15.5)
WBC: 10.4 10*3/uL (ref 4.0–10.5)
WBC: 22.9 10*3/uL — ABNORMAL HIGH (ref 4.0–10.5)

## 2017-10-16 LAB — HEMOGLOBIN A1C
Hgb A1c MFr Bld: 6.8 % — ABNORMAL HIGH (ref 4.8–5.6)
MEAN PLASMA GLUCOSE: 148.46 mg/dL

## 2017-10-16 LAB — GLUCOSE, CAPILLARY
GLUCOSE-CAPILLARY: 98 mg/dL (ref 65–99)
Glucose-Capillary: 170 mg/dL — ABNORMAL HIGH (ref 65–99)

## 2017-10-16 LAB — PROTIME-INR
INR: 1.13
INR: 1.16
PROTHROMBIN TIME: 14.7 s (ref 11.4–15.2)
Prothrombin Time: 14.4 seconds (ref 11.4–15.2)

## 2017-10-16 LAB — TRIGLYCERIDES: Triglycerides: 75 mg/dL (ref <150)

## 2017-10-16 LAB — APTT: aPTT: 31 seconds (ref 24–36)

## 2017-10-16 SURGERY — BRONCHOSCOPY, RIGID
Anesthesia: General

## 2017-10-16 MED ORDER — LEVETIRACETAM IN NACL 1000 MG/100ML IV SOLN
1000.0000 mg | Freq: Once | INTRAVENOUS | Status: AC
  Administered 2017-10-16: 1000 mg via INTRAVENOUS
  Filled 2017-10-16: qty 100

## 2017-10-16 MED ORDER — ASPIRIN 81 MG PO CHEW: 324.0000 mg | CHEWABLE_TABLET | ORAL | Status: DC

## 2017-10-16 MED ORDER — METRONIDAZOLE IN NACL 5-0.79 MG/ML-% IV SOLN
500.0000 mg | Freq: Three times a day (TID) | INTRAVENOUS | Status: DC
  Administered 2017-10-16 – 2017-10-17 (×3): 500 mg via INTRAVENOUS
  Filled 2017-10-16 (×3): qty 100

## 2017-10-16 MED ORDER — DEXAMETHASONE SODIUM PHOSPHATE 10 MG/ML IJ SOLN
INTRAMUSCULAR | Status: AC
  Filled 2017-10-16: qty 1

## 2017-10-16 MED ORDER — LORAZEPAM 2 MG/ML IJ SOLN
1.0000 mg | Freq: Once | INTRAMUSCULAR | Status: AC
  Administered 2017-10-16: 1 mg via INTRAVENOUS

## 2017-10-16 MED ORDER — ONDANSETRON HCL 4 MG/2ML IJ SOLN
INTRAMUSCULAR | Status: AC
  Filled 2017-10-16: qty 2

## 2017-10-16 MED ORDER — LORAZEPAM 2 MG/ML IJ SOLN
INTRAMUSCULAR | Status: AC
  Administered 2017-10-16: 1 mg via INTRAVENOUS
  Filled 2017-10-16: qty 1

## 2017-10-16 MED ORDER — DEXMEDETOMIDINE HCL IN NACL 200 MCG/50ML IV SOLN
INTRAVENOUS | Status: DC | PRN
  Administered 2017-10-16: 0.7 ug/kg/h via INTRAVENOUS

## 2017-10-16 MED ORDER — ASPIRIN 300 MG RE SUPP: 300.0000 mg | RECTAL | Status: DC

## 2017-10-16 MED ORDER — ROCURONIUM BROMIDE 10 MG/ML (PF) SYRINGE
PREFILLED_SYRINGE | INTRAVENOUS | Status: DC | PRN
  Administered 2017-10-16: 50 mg via INTRAVENOUS
  Administered 2017-10-16: 20 mg via INTRAVENOUS

## 2017-10-16 MED ORDER — PROPOFOL 1000 MG/100ML IV EMUL
5.0000 ug/kg/min | INTRAVENOUS | Status: DC
  Administered 2017-10-16: 20 ug/kg/min via INTRAVENOUS
  Filled 2017-10-16 (×2): qty 100

## 2017-10-16 MED ORDER — ONDANSETRON HCL 4 MG/2ML IJ SOLN: 4.0000 mg | Freq: Four times a day (QID) | INTRAMUSCULAR | Status: DC | PRN

## 2017-10-16 MED ORDER — MAGNESIUM HYDROXIDE 400 MG/5ML PO SUSP: 15.0000 mL | Freq: Every day | ORAL | Status: DC | PRN

## 2017-10-16 MED ORDER — ALBUMIN HUMAN 5 % IV SOLN
12.5000 g | Freq: Once | INTRAVENOUS | Status: AC
  Administered 2017-10-16: 12.5 g via INTRAVENOUS

## 2017-10-16 MED ORDER — PROPOFOL 500 MG/50ML IV EMUL
INTRAVENOUS | Status: DC | PRN
  Administered 2017-10-16: 100 ug/kg/min via INTRAVENOUS

## 2017-10-16 MED ORDER — ALBUMIN HUMAN 5 % IV SOLN
INTRAVENOUS | Status: AC
  Administered 2017-10-16: 12.5 g via INTRAVENOUS
  Filled 2017-10-16: qty 250

## 2017-10-16 MED ORDER — ALBUMIN HUMAN 5 % IV SOLN
INTRAVENOUS | Status: AC
  Filled 2017-10-16: qty 250

## 2017-10-16 MED ORDER — FENTANYL CITRATE (PF) 100 MCG/2ML IJ SOLN: 25.0000 ug | INTRAMUSCULAR | Status: DC | PRN

## 2017-10-16 MED ORDER — GI COCKTAIL ~~LOC~~: 30.0000 mL | Freq: Three times a day (TID) | ORAL | Status: DC | PRN

## 2017-10-16 MED ORDER — LEVETIRACETAM IN NACL 500 MG/100ML IV SOLN
500.0000 mg | Freq: Two times a day (BID) | INTRAVENOUS | Status: DC
  Filled 2017-10-16: qty 100

## 2017-10-16 MED ORDER — METHYLPREDNISOLONE SODIUM SUCC 40 MG IJ SOLR
40.0000 mg | Freq: Two times a day (BID) | INTRAMUSCULAR | Status: DC
  Administered 2017-10-16 – 2017-10-17 (×3): 40 mg via INTRAVENOUS
  Filled 2017-10-16 (×3): qty 1

## 2017-10-16 MED ORDER — DEXAMETHASONE SODIUM PHOSPHATE 10 MG/ML IJ SOLN
INTRAMUSCULAR | Status: DC | PRN
  Administered 2017-10-16: 10 mg via INTRAVENOUS

## 2017-10-16 MED ORDER — SUGAMMADEX SODIUM 200 MG/2ML IV SOLN
INTRAVENOUS | Status: DC | PRN
  Administered 2017-10-16: 100 mg via INTRAVENOUS

## 2017-10-16 MED ORDER — LEVOFLOXACIN IN D5W 500 MG/100ML IV SOLN
500.0000 mg | INTRAVENOUS | Status: DC
  Administered 2017-10-16: 500 mg via INTRAVENOUS
  Filled 2017-10-16 (×2): qty 100

## 2017-10-16 MED ORDER — DEXTROSE-NACL 5-0.9 % IV SOLN
INTRAVENOUS | Status: DC
  Administered 2017-10-17 (×2): via INTRAVENOUS

## 2017-10-16 MED ORDER — AMIODARONE HCL IN DEXTROSE 360-4.14 MG/200ML-% IV SOLN
30.0000 mg/h | INTRAVENOUS | Status: DC
  Administered 2017-10-16 – 2017-10-17 (×2): 30 mg/h via INTRAVENOUS
  Filled 2017-10-16 (×2): qty 200

## 2017-10-16 MED ORDER — VALPROATE SODIUM 500 MG/5ML IV SOLN
30.0000 mg/kg | Freq: Once | INTRAVENOUS | Status: AC
  Administered 2017-10-16: 1416 mg via INTRAVENOUS
  Filled 2017-10-16 (×2): qty 14.16

## 2017-10-16 MED ORDER — ROCURONIUM BROMIDE 10 MG/ML (PF) SYRINGE
PREFILLED_SYRINGE | INTRAVENOUS | Status: AC
  Filled 2017-10-16: qty 5

## 2017-10-16 MED ORDER — ORAL CARE MOUTH RINSE: 15.0000 mL | Freq: Four times a day (QID) | OROMUCOSAL | Status: DC

## 2017-10-16 MED ORDER — PHENYLEPHRINE 40 MCG/ML (10ML) SYRINGE FOR IV PUSH (FOR BLOOD PRESSURE SUPPORT)
PREFILLED_SYRINGE | INTRAVENOUS | Status: DC | PRN
  Administered 2017-10-16: 120 ug via INTRAVENOUS
  Administered 2017-10-16 (×3): 80 ug via INTRAVENOUS

## 2017-10-16 MED ORDER — LEVOTHYROXINE SODIUM 125 MCG PO TABS
125.0000 ug | ORAL_TABLET | Freq: Every day | ORAL | Status: DC
  Administered 2017-10-17: 125 ug via ORAL
  Filled 2017-10-16 (×2): qty 1

## 2017-10-16 MED ORDER — EPHEDRINE SULFATE-NACL 50-0.9 MG/10ML-% IV SOSY
PREFILLED_SYRINGE | INTRAVENOUS | Status: DC | PRN
  Administered 2017-10-16: 10 mg via INTRAVENOUS

## 2017-10-16 MED ORDER — CHLORHEXIDINE GLUCONATE 0.12% ORAL RINSE (MEDLINE KIT)
15.0000 mL | Freq: Two times a day (BID) | OROMUCOSAL | Status: DC
  Administered 2017-10-16: 15 mL via OROMUCOSAL

## 2017-10-16 MED ORDER — IOPAMIDOL (ISOVUE-370) INJECTION 76%: 50.0000 mL | Freq: Once | INTRAVENOUS | Status: DC | PRN

## 2017-10-16 MED ORDER — IOPAMIDOL (ISOVUE-370) INJECTION 76%
INTRAVENOUS | Status: AC
  Administered 2017-10-16: 50 mL
  Filled 2017-10-16: qty 50

## 2017-10-16 MED ORDER — LIDOCAINE 2% (20 MG/ML) 5 ML SYRINGE
INTRAMUSCULAR | Status: DC | PRN
  Administered 2017-10-16: 60 mg via INTRAVENOUS

## 2017-10-16 MED ORDER — ONDANSETRON HCL 4 MG/2ML IJ SOLN
INTRAMUSCULAR | Status: DC | PRN
  Administered 2017-10-16: 4 mg via INTRAVENOUS

## 2017-10-16 MED ORDER — LACTATED RINGERS IV SOLN
INTRAVENOUS | Status: DC
  Administered 2017-10-16: 08:00:00 via INTRAVENOUS

## 2017-10-16 MED ORDER — MIDAZOLAM HCL 2 MG/2ML IJ SOLN
INTRAMUSCULAR | Status: DC | PRN
  Administered 2017-10-16 (×2): 1 mg via INTRAVENOUS

## 2017-10-16 MED ORDER — EPINEPHRINE PF 1 MG/10ML IJ SOSY
PREFILLED_SYRINGE | INTRAMUSCULAR | Status: AC
  Filled 2017-10-16: qty 10

## 2017-10-16 MED ORDER — IPRATROPIUM-ALBUTEROL 0.5-2.5 (3) MG/3ML IN SOLN: 3.0000 mL | Freq: Four times a day (QID) | RESPIRATORY_TRACT | Status: DC | PRN

## 2017-10-16 MED ORDER — PROPOFOL 10 MG/ML IV BOLUS
INTRAVENOUS | Status: DC | PRN
  Administered 2017-10-16: 100 mg via INTRAVENOUS

## 2017-10-16 MED ORDER — EPINEPHRINE PF 1 MG/ML IJ SOLN
INTRAMUSCULAR | Status: AC
  Filled 2017-10-16: qty 1

## 2017-10-16 MED ORDER — DIPHENHYDRAMINE HCL 25 MG PO CAPS: 25.0000 mg | ORAL_CAPSULE | Freq: Every evening | ORAL | Status: DC | PRN

## 2017-10-16 MED ORDER — LIDOCAINE 2% (20 MG/ML) 5 ML SYRINGE
INTRAMUSCULAR | Status: AC
  Filled 2017-10-16: qty 5

## 2017-10-16 MED ORDER — ORAL CARE MOUTH RINSE
15.0000 mL | OROMUCOSAL | Status: DC
  Administered 2017-10-17 (×9): 15 mL via OROMUCOSAL

## 2017-10-16 MED ORDER — 0.9 % SODIUM CHLORIDE (POUR BTL) OPTIME
TOPICAL | Status: DC | PRN
  Administered 2017-10-16: 1000 mL

## 2017-10-16 MED ORDER — PHENYLEPHRINE HCL 10 MG/ML IJ SOLN
INTRAVENOUS | Status: DC | PRN
  Administered 2017-10-16: 25 ug/min via INTRAVENOUS

## 2017-10-16 MED ORDER — CHLORHEXIDINE GLUCONATE 0.12% ORAL RINSE (MEDLINE KIT)
15.0000 mL | Freq: Two times a day (BID) | OROMUCOSAL | Status: DC
  Administered 2017-10-17 (×2): 15 mL via OROMUCOSAL

## 2017-10-16 MED ORDER — FAMOTIDINE IN NACL 20-0.9 MG/50ML-% IV SOLN
20.0000 mg | Freq: Two times a day (BID) | INTRAVENOUS | Status: DC
  Administered 2017-10-16 – 2017-10-17 (×2): 20 mg via INTRAVENOUS
  Filled 2017-10-16 (×2): qty 50

## 2017-10-16 MED ORDER — FENTANYL CITRATE (PF) 250 MCG/5ML IJ SOLN
INTRAMUSCULAR | Status: AC
  Filled 2017-10-16: qty 5

## 2017-10-16 MED ORDER — LEVETIRACETAM IN NACL 1000 MG/100ML IV SOLN
1000.0000 mg | Freq: Two times a day (BID) | INTRAVENOUS | Status: DC
  Administered 2017-10-17 (×2): 1000 mg via INTRAVENOUS
  Filled 2017-10-16 (×4): qty 100

## 2017-10-16 MED ORDER — FENTANYL CITRATE (PF) 100 MCG/2ML IJ SOLN
50.0000 ug | INTRAMUSCULAR | Status: DC | PRN
  Administered 2017-10-17: 50 ug via INTRAVENOUS
  Filled 2017-10-16 (×2): qty 2

## 2017-10-16 MED ORDER — ACETAMINOPHEN 325 MG PO TABS: 650.0000 mg | ORAL_TABLET | Freq: Four times a day (QID) | ORAL | Status: DC | PRN

## 2017-10-16 MED ORDER — FENTANYL CITRATE (PF) 100 MCG/2ML IJ SOLN
INTRAMUSCULAR | Status: DC | PRN
  Administered 2017-10-16: 25 ug via INTRAVENOUS
  Administered 2017-10-16 (×2): 50 ug via INTRAVENOUS
  Administered 2017-10-16: 25 ug via INTRAVENOUS

## 2017-10-16 MED ORDER — IPRATROPIUM-ALBUTEROL 0.5-2.5 (3) MG/3ML IN SOLN
3.0000 mL | Freq: Four times a day (QID) | RESPIRATORY_TRACT | Status: DC
  Filled 2017-10-16: qty 3

## 2017-10-16 MED ORDER — EPINEPHRINE PF 1 MG/ML IJ SOLN
INTRAMUSCULAR | Status: DC | PRN
  Administered 2017-10-16: 1 mg via ENDOTRACHEOPULMONARY

## 2017-10-16 MED ORDER — PROPOFOL 10 MG/ML IV BOLUS
INTRAVENOUS | Status: AC
  Filled 2017-10-16: qty 20

## 2017-10-16 MED ORDER — MIDAZOLAM HCL 2 MG/2ML IJ SOLN
INTRAMUSCULAR | Status: AC
  Filled 2017-10-16: qty 2

## 2017-10-16 MED ORDER — ALBUTEROL SULFATE (2.5 MG/3ML) 0.083% IN NEBU: 2.5000 mg | INHALATION_SOLUTION | Freq: Four times a day (QID) | RESPIRATORY_TRACT | Status: DC | PRN

## 2017-10-16 MED ORDER — SODIUM CHLORIDE 0.9 % IV SOLN: 250.0000 mL | INTRAVENOUS | Status: DC | PRN

## 2017-10-16 MED ORDER — EPINEPHRINE PF 1 MG/ML IJ SOLN
INTRAMUSCULAR | Status: DC | PRN
  Administered 2017-10-16: .2 mg via INTRAVENOUS
  Administered 2017-10-16: .3 mg via INTRAVENOUS

## 2017-10-16 SURGICAL SUPPLY — 33 items
BRUSH CYTOL CELLEBRITY 1.5X140 (MISCELLANEOUS) IMPLANT
CANISTER SUCT 3000ML PPV (MISCELLANEOUS) ×4 IMPLANT
CONT SPEC 4OZ CLIKSEAL STRL BL (MISCELLANEOUS) ×12 IMPLANT
COVER BACK TABLE 60X90IN (DRAPES) ×4 IMPLANT
COVER DOME SNAP 22 D (MISCELLANEOUS) ×4 IMPLANT
FILTER STRAW FLUID ASPIR (MISCELLANEOUS) ×4 IMPLANT
FORCEPS BIOP RJ4 1.8 (CUTTING FORCEPS) ×4 IMPLANT
FORCEPS RADIAL JAW LRG 4 PULM (INSTRUMENTS) ×2 IMPLANT
GAUZE SPONGE 4X4 12PLY STRL (GAUZE/BANDAGES/DRESSINGS) ×8 IMPLANT
GLOVE SURG SIGNA 7.5 PF LTX (GLOVE) ×4 IMPLANT
GOWN STRL REUS W/ TWL XL LVL3 (GOWN DISPOSABLE) ×2 IMPLANT
GOWN STRL REUS W/TWL XL LVL3 (GOWN DISPOSABLE) ×2
KIT CLEAN ENDO COMPLIANCE (KITS) ×8 IMPLANT
KIT TURNOVER KIT B (KITS) ×4 IMPLANT
MARKER SKIN DUAL TIP RULER LAB (MISCELLANEOUS) ×4 IMPLANT
NEEDLE BLUNT 18X1 FOR OR ONLY (NEEDLE) IMPLANT
NEEDLE ECHOTIP HI DEF 22GA (NEEDLE) IMPLANT
NS IRRIG 1000ML POUR BTL (IV SOLUTION) ×4 IMPLANT
OIL SILICONE PENTAX (PARTS (SERVICE/REPAIRS)) ×4 IMPLANT
PAD ARMBOARD 7.5X6 YLW CONV (MISCELLANEOUS) ×8 IMPLANT
RADIAL JAW LRG 4 PULMONARY (INSTRUMENTS) ×2
SYR 20CC LL (SYRINGE) ×4 IMPLANT
SYR 20ML ECCENTRIC (SYRINGE) ×8 IMPLANT
SYR 3ML LL SCALE MARK (SYRINGE) ×4 IMPLANT
SYR 5ML LL (SYRINGE) ×4 IMPLANT
SYR 5ML LUER SLIP (SYRINGE) ×4 IMPLANT
TOWEL GREEN STERILE (TOWEL DISPOSABLE) ×4 IMPLANT
TOWEL GREEN STERILE FF (TOWEL DISPOSABLE) ×4 IMPLANT
TRAP SPECIMEN MUCOUS 40CC (MISCELLANEOUS) ×4 IMPLANT
TUBE CONNECTING 20'X1/4 (TUBING) ×1
TUBE CONNECTING 20X1/4 (TUBING) ×3 IMPLANT
VALVE DISPOSABLE (MISCELLANEOUS) ×8 IMPLANT
WATER STERILE IRR 1000ML POUR (IV SOLUTION) ×4 IMPLANT

## 2017-10-16 NOTE — Transfer of Care (Signed)
Immediate Anesthesia Transfer of Care Note  Patient: Diane Olson  Procedure(s) Performed: VIDEO BRONCHOSCOPY WITH ENDOBRONCHIAL ULTRASOUND (N/A ) RIGID BRONCHOSCOPY (N/A )  Patient Location: PACU  Anesthesia Type:General  Level of Consciousness: Patient remains intubated per anesthesia plan  Airway & Oxygen Therapy: Patient remains intubated per anesthesia plan and Patient placed on Ventilator (see vital sign flow sheet for setting)  Post-op Assessment: Report given to RN  Post vital signs: Reviewed and unstable  Last Vitals:  Vitals Value Taken Time  BP 161/117 10/20/2017 12:57 PM  Temp 36 C 10/08/2017 12:52 PM  Pulse 128 10/20/2017 12:57 PM  Resp 33 10/23/2017 12:57 PM  SpO2 100 % 10/24/2017 12:57 PM  Vitals shown include unvalidated device data.  Last Pain:  Vitals:   10/05/2017 0801  TempSrc: Oral  PainSc:       Patients Stated Pain Goal: 2 (23/30/07 6226)  Complications: respiratory complications

## 2017-10-16 NOTE — Interval H&P Note (Signed)
History and Physical Interval Note:  10/01/2017 9:47 AM  Diane Olson  has presented today for surgery, with the diagnosis of LLL MASS MEDIASTINAL ADENOPATHY  The various methods of treatment have been discussed with the patient and family. After consideration of risks, benefits and other options for treatment, the patient has consented to  Procedure(s): [place] (N/A) as a surgical intervention .  The patient's history has been reviewed, patient examined, no change in status, stable for surgery.  I have reviewed the patient's chart and labs.  Questions were answered to the patient's satisfaction.     Melrose Nakayama

## 2017-10-16 NOTE — Progress Notes (Signed)
Precedex off at 1343. Dr. Jefm Petty notified about patient posturing more than earlier. Will let Dr. Roxan Hockey know.

## 2017-10-16 NOTE — Consult Note (Signed)
Requesting Physician: Dr. Ola Spurr    Chief Complaint: Normal posturing status post bronchoscopy  History obtained from:  Chart    HPI:                                                                                                                                         Diane Olson is an 72 y.o. female was brought to the hospital today as an outpatient procedure for tracheal mass biopsy.  Initially a flexible bronchoscopy was attempted however this was not able to reach the mass thus a rigid bronchoscopy was needed.  During the procedure there was some complications with bleeding.  After patient was brought back to the PACU patient was noted to have some abnormal extensor  posturing.  There was noted to be a period of hypotension during the procedure however no significant decrease in oxygenation.  The hypotension did necessitate the use of epinephrine.  Talking to the anesthesiologist the hypotension lasted only for a brief period of time.  Currently she is in the PACU intubated, does not follow any commands.  Patient does show extensor posturing with bilateral arms to noxious stimuli and does not localize to pain.  Bilateral legs also extensor forward/decerebrate posture however when noxious stimuli is given she does purposely withdrawal a no triple flexion is noted.  Date last known well: Date: 10/21/2017 Time last known well: Time: 07:30 tPA Given: No: out side window   Modified Rankin: Rankin Score=0    Past Medical History:  Diagnosis Date  . Anemia    in 20's  . Complication of anesthesia    slow to wake up after hysterectomy  . COPD (chronic obstructive pulmonary disease) ([place])   . Diabetes mellitus without complication ([place])   . History of kidney stones   . Hypertension    no longer on medications since weight loss  . Hypothyroidism   . Pneumonia   . Thyroid disease     Past Surgical History:  Procedure Laterality Date  . ABDOMINAL HYSTERECTOMY    .  HEMORRHOIDECTOMY WITH HEMORRHOID BANDING    . TONSILLECTOMY      Family History  Problem Relation Age of Onset  . Lung cancer Mother     Social History:  reports that she quit smoking about 4 weeks ago. She has a 30.00 pack-year smoking history. She has never used smokeless tobacco. She reports that she does not drink alcohol or use drugs.  Allergies:  Allergies  Allergen Reactions  . Amoxicillin-Pot Clavulanate Swelling  . Benazepril Swelling  . Lisinopril     Medications:  Scheduled:  Continuous: . albumin human    . lactated ringers 10 mL/hr at 10/11/2017 0810   FWY:OVZCHYIF (SUBLIMAZE) injection  ROS:                                                                                                                                       History obtained from unobtainable from patient due to Intubated    General Examination:                                                                                                      Blood pressure 106/69, pulse (!) 101, temperature (!) 96.8 F (36 C), resp. rate (!) 33, height _0  (1.6 m), weight 47.2 kg (104 lb), SpO2 100 %.  HEENT-  Normocephalic, no lesions, without obvious abnormality.  Normal external eye and conjunctiva.   Cardiovascular- , pulses palpable throughout   Lungs no excessive working breathing.  Saturations within normal limits-scattered rhonchi Abdomen- All 4 quadrants palpated and nontender Extremities- Warm, dry and intact Musculoskeletal-no joint tenderness, deformity or swelling Skin-warm and dry, no hyperpigmentation, vitiligo, or suspicious lesions  Neurological Examination Mental Status: Intubated not following commands. Breathing over the vent. decerebrate posturing with upper extremity noxious stimuli and and withdrawing from pain in LE  Cranial Nerves: II: no blink to threat. When  light flashed in eyes eyes had downward bobbing.  III,IV, VI: ptosis not present, doll's with disconjugate gaze right eye down and in and left eye down and out. , pupils equal, round,sluggishly reactive to light and accommodation V,VII: face symmetric, facial light touch sensation normal bilaterally  Motor: decerebrate posturing with upper extremity noxious stimuli and and withdrawing from pain in LE  Sensory: as above Deep Tendon Reflexes: 2+ and symmetric throughout--sustained clonus bilateral ankles Plantars: Up going bilaterally    Lab Results: Basic Metabolic Panel: Recent Labs  Lab 10/25/2017 0754  NA 139  K 3.7  CL 102  CO2 25  GLUCOSE 109*  BUN 13  CREATININE 0.67  CALCIUM 9.7    CBC: Recent Labs  Lab 10/25/2017 0754  WBC 10.4  HGB 10.8*  HCT 33.7*  MCV 88.9  PLT 362    Lipid Panel: No results for input(s): CHOL, TRIG, HDL, CHOLHDL, VLDL, LDLCALC in the last 168 hours.  CBG: Recent Labs  Lab 10/06/2017 0817 10/14/2017 1303  GLUCAP 98 170*    Imaging: Dg Chest 2 View  Result Date: 10/01/2017 CLINICAL DATA:  Preop evaluation for bronchoscopy EXAM: CHEST -  2 VIEW COMPARISON:  09/26/2017, PET-CT, 09/01/2017 chest x-ray FINDINGS: Cardiac shadow is stable. Central left mass lesion is noted similar to that seen on prior CT examinations. Increasing peripheral infiltrative density is noted in the left lower lobe when compare with the prior exam likely related to central bronchial obstruction. IMPRESSION: Persistent central left lower lobe mass with increasing peripheral consolidation. Electronically Signed   By: Inez Catalina M.D.   On: 09/30/2017 08:14    Assessment and plan discussed with with attending physician and they are in agreement.    Etta Quill PA-C [facility] [phone]  10/05/2017, 2:35 PM   Attending addendum Patient seen and examined with Etta Quill PA-C MRI, MRA head and neck reviewed independently.  Assessment: 72 y.o. female  brought for outpatient tracheal mass biopsy which during surgery had complications with the flexible bronchoscopy which necessitated a rigid bronchoscopy.  During the procedure there was also complications of bleeding.  Postoperatively patient was showing decerebrate posturing and is intubated.  There is concern that patient may have a brainstem pathology.  Patient is being brought down for immediate MRI/RA of brain. MRI of the brain reveals areas of restricted diffusion in the left pons, right cerebellum, right PCA territory and possible restricted diffusion cortically in the frontal lobes as well. Findings can be consistent with an embolic stroke versus stroke secondary to hypoperfusion versus hypoxic injury.  Some of the frontal lobe findings or cortical restricted diffusion can also be secondary to an ictal or postictal phenomenon. At this time, it is not possible for me to tie this down to 1 possible etiology. Differentials include: -Acute ischemic stroke in the brainstem-left pons, right cerebellum, right PCA and frontal lobes-likely embolic versus hypoperfusion -Hypoxic injury secondary to hypoperfusion -Evaluate for underlying seizures  Stroke Risk Factors - diabetes mellitus and hypertension  Recommend Stat MRI obtained-reviewed-as above. MRI of the head and neck also demonstrates some abnormal flow in the left vertebral artery, which is stable from the MRI from late March.  Unclear if this is the culprit for the pontine stroke. Will obtain CT angiogram of the head and neck We will also obtain stat EEG Will hold off on starting antiepileptics until the EEG is completed and reviewed. Supportive management in the ICU per primary team. Antiplatelets-aspirin 325 Statin-atorvastatin 80 2D echocardiogram Telemetry Frequent neurochecks A1c, lipid panel   -- Amie Portland, MD [facility] Pager: [phone] If 7pm to 7am, please call on call as listed on AMION.   CRITICAL  CARE ATTESTATION This patient is critically ill and at significant risk of neurological worsening, death and care requires constant monitoring of vital signs, hemodynamics,respiratory and cardiac monitoring. I spent 55 minutes of neurocritical care time performing neurological assessment, discussion with family, other specialists and medical decision making of high complexityin the care of  this patient.    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  Interval growth of subcarinal mass/lymph adenopathy now occluding the left mainstem bronchus and new pneumomediastinum tracking to the neck.  I have communicated the critical mediastinal findings to Dr. Roxan Hockey.  Given this new information from CTA, this explains the predilection for the posterior circulation for this event. Also, patient started having twitching of the right face and right eyelids. Stat EEG has been performed at this time. Patient was loaded with  1 g of Keppra.  Continue Keppra 500 mg twice daily. She was also given 2 mg of Ativan. Neurology will continue to follow.  Next line please call with questions   CRITICAL CARE ATTESTATION This patient is critically ill and at significant risk of neurological worsening, death and care requires constant monitoring of vital signs, hemodynamics,respiratory and cardiac monitoring. I spent an additional 60  minutes of neurocritical care time performing neurological assessment, discussion with family, other specialists and medical decision making of high complexity in the care of  this patient.

## 2017-10-16 NOTE — Anesthesia Preprocedure Evaluation (Addendum)
Anesthesia Evaluation  Patient identified by MRN, date of birth, ID band Patient awake    Reviewed: Allergy & Precautions, H&P , NPO status , Patient's Chart, lab work & pertinent test results  Airway Mallampati: II  TM Distance: >3 FB Neck ROM: Full    Dental no notable dental hx. (+) Teeth Intact, Dental Advisory Given   Pulmonary COPD, former smoker,     + wheezing      Cardiovascular hypertension,  Rhythm:Regular Rate:Normal     Neuro/Psych negative neurological ROS  negative psych ROS   GI/Hepatic negative GI ROS, Neg liver ROS,   Endo/Other  diabetesHypothyroidism   Renal/GU negative Renal ROS  negative genitourinary   Musculoskeletal   Abdominal   Peds  Hematology  (+) anemia ,   Anesthesia Other Findings   Reproductive/Obstetrics negative OB ROS                            Anesthesia Physical Anesthesia Plan  ASA: III  Anesthesia Plan: General   Post-op Pain Management:    Induction: Intravenous  PONV Risk Score and Plan: 4 or greater and Ondansetron and Treatment may vary due to age or medical condition  Airway Management Planned: Oral ETT  Additional Equipment:   Intra-op Plan:   Post-operative Plan: Extubation in OR  Informed Consent: I have reviewed the patients History and Physical, chart, labs and discussed the procedure including the risks, benefits and alternatives for the proposed anesthesia with the patient or authorized representative who has indicated his/her understanding and acceptance.   Dental advisory given  Plan Discussed with: CRNA  Anesthesia Plan Comments:        Anesthesia Quick Evaluation

## 2017-10-16 NOTE — Op Note (Signed)
NAMEORISSA, ARREAGA             ACCOUNT NO.:  [id number]  MEDICAL RECORD NO.:  [id number]  LOCATION:                                 FACILITY:  PHYSICIAN:  Revonda Standard. Roxan Hockey, M.D. DATE OF BIRTH:  DATE OF PROCEDURE:  10/24/2017 DATE OF DISCHARGE:                              OPERATIVE REPORT   PREOPERATIVE DIAGNOSIS:  Left lower lobe mass with mediastinal adenopathy.  POSTOPERATIVE DIAGNOSIS:  Squamous cell carcinoma of the lung.  PROCEDURE:  Flexible and rigid bronchoscopy with endobronchial biopsies.  SURGEON:  Revonda Standard. Roxan Hockey, M.D.  ASSISTANT:  None.  ANESTHESIA:  General.  FINDINGS:  Extensive blood in the airways with clot, that was unable to be cleared with a flexible scope.  Clot cleared with rigid scope, developed bradycardia and hypotension during rigid bronchoscopy, recovered rapidly with epinephrine.  Biopsies showed squamous cell carcinoma.  CLINICAL NOTE:  The patient is a 72 year old woman with a history of tobacco abuse, who recently presented with hemoptysis.  A CT of the chest showed a left lower lobe mass with bulky hilar and mediastinal adenopathy.  She was referred to Oncology.  PET-CT and MR of the brain were performed.  PET-CT showed hypermetabolic activity in the left lower lobe mass along with paratracheal and subcarinal lymph nodes.  MR showed no evidence of brain metastasis.  She was advised to undergo bronchoscopy for diagnosis and staging with the possibility of performing endobronchial ultrasound as well.  The indications, risks, benefits and alternatives were discussed in detail with the patient. She understood and accepted the risks and agreed to proceed.  OPERATIVE NOTE:  The patient was brought to the operating room on October 16, 2017.  She had induction of general anesthesia and was intubated. After performing a time-out, flexible fiberoptic bronchoscopy was performed via the endotracheal tube.  There was extensive clotted  blood in the distal trachea and both mainstem bronchi.  Initially, it was difficult to tell the location relative to the carina and right from left. The left mainstem was completely obliterated by the clot.  Saline irrigation and attempts to remove the clot were unsuccessful and there was inadequate visualization to biopsy the mass.  The decision was made to proceed with rigid bronchoscopy.  The equipment was prepared.  The jet ventilation line was hooked to the rigid bronchoscope.  The patient was extubated and the rigid bronchoscope was advanced into the trachea. There was extensive blood in the trachea and additional bleeding from placement of the rigid bronchoscope due to what turned out to be tumor that the rigid scope had scraped off the wall of the trachea with insertion.  There was extensive blood.  This was evacuated with saline irrigation and suction.  Saturations remained 100%. Clot obstructed the camera of the rigid scope and it was removed, the clot was cleared. The rigid scope was reinserted and additional removal of clot was performed.  The patient developed bradycardia into the 40s and profound hypotension with a blood pressure of 50.  Dr. Oren Bracket was present at the time and resuscitated the patient with intravenous epinephrine.  The rigid bronchoscope was removed and the patient was reintubated with an endotracheal tube.  There was  some difficulty ventilating the patient due to persistent blood in the airways.  The flexible scope was replaced and the remainder of the blood was cleared with saline and ventilation became much easier.  The blood pressure and heart rate recovered shortly after administering the intravenous epinephrine. Topical epinephrine was applied in the airway.  Once the blood was cleared, it was clear that there was extensive tumor infiltration into the trachea and left mainstem bronchus.  The carina was essentially completely replaced with  tumor, and there was also extensive tumor along the posterior aspect of the trachea.  Biopsies were taken from the carina and sent for frozen section as well as permanent pathology. There did not appear to be any ongoing bleeding, so, the bronchoscope was removed.  The frozen section returned showing squamous cell carcinoma.  Decision was made to take the patient to the recovery room, intubated and attempt to extubate her there.  The patient was transported from the operating room to the [place] [facility], intubated and in stable condition.     Revonda Standard Roxan Hockey, M.D.     SCH/MEDQ  D:  10/04/2017  T:  10/18/2017  Job:  [id number]

## 2017-10-16 NOTE — Progress Notes (Addendum)
      [address]       [place] [address]             [phone]      During rigid bronchoscopy Mrs. Chim had transient hypotension and bradycardia. She was resuscitated with epinephrine and BP and HR rapidly improved. She was reintubated and biopsies completed with flexible scope.  Was taken to the PACU intubated with the plan to extubate her there. After my next case was underway, her sedation was stopped and she was noted to be posturing. Dr. Ola Spurr evaluated and sent her for an MR of the brain. She was seen in consultation by Dr. Rory Percy of Neurology. 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 She was noted to have seizure activity and that is being treated. Too early to know a prognosis, but this appears to be a major event. Family has been updated.  Revonda Standard Roxan Hockey, MD [facility] [phone]   CT neck showed pneumomediastinum. Will start empiric IV levaquin and flagyl as she has a penicillin allergy  Remo Lipps C. Roxan Hockey, MD [facility] [phone]

## 2017-10-16 NOTE — Progress Notes (Signed)
RT transported pt on vent from PACU to MRI then to 2H15.  Pt's vitals remained stable throughout the trips. Report given to Unit RT.

## 2017-10-16 NOTE — Progress Notes (Signed)
Patient transported on vent to 2H15 from MRI with Collie Siad RT, Kelli Churn RN-Rapid Response. Report given to [facility] RN-2H.

## 2017-10-16 NOTE — Progress Notes (Signed)
vLTM EEG running. Neuro notified

## 2017-10-16 NOTE — Anesthesia Postprocedure Evaluation (Signed)
Anesthesia Post Note  Patient: Diane Olson  Procedure(s) Performed: RIGID BRONCHOSCOPY (N/A ) VIDEO BRONCHOSCOPY WITH BIOPSIES     Patient location during evaluation: SICU Anesthesia Type: General Level of consciousness: obtunded/minimal responses Pain management: pain level controlled Vital Signs Assessment: post-procedure vital signs reviewed and stable Respiratory status: patient on ventilator - see flowsheet for VS and respiratory function unstable Cardiovascular status: stable Postop Assessment: no apparent nausea or vomiting Anesthetic complications: yes Anesthetic complication details: anesthesia complicationsComments: Pt has had a stroke during the procedure. Neurology and CCM have been consulted and are following.    Last Vitals:  Vitals:   10/03/2017 1437 10/04/2017 1440  BP: 110/76   Pulse: 93 94  Resp: 15 15  Temp:    SpO2: 100% 100%    Last Pain:  Vitals:   10/04/2017 0801  TempSrc: Oral  PainSc:                  Janace Decker,W. EDMOND

## 2017-10-16 NOTE — Progress Notes (Signed)
CT surgery p.m. Rounds  Resting comfortably on ventilator No seizure activity noted Oxygenation satisfactory with  saturation greater than 90% on FiO2 of 50 Sinus tachycardia with normal temperature, blood pressure.  6 PM chest x-ray shows no pneumothorax

## 2017-10-16 NOTE — Consult Note (Addendum)
Diane Olson, M.D Pulmonary Critical Care & Sleep Medicine   ICU Patient Progress Note  Name: Diane Olson  DOB: 1946-05-24  MRN: [id number]  Date: 10/09/2017   '[x]'$ I have reviewed the flowsheet and previous day's notes.  IMPRESSION:  Patient Prior Problem List   Diagnosis Date Noted  . Lung cancer, main bronchus, left ([place]) 09/30/2017  . Status post surgery 10/24/2017  . Malignant neoplasm of bronchus and lung ([place]) 09/30/2017  . Cavitating mass of lower lobe of left lung 09/15/2017  . 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 Sq cell with opposite side N2 LN involved suggestive of higher stage CA 3b or so  Possible COPD no PFT available in system Pneumomediastinum and worsening mass reported on CT neck- Management per CT surgery- Will consider Steroids 09/15/2017     PLAN:  CVS:Try to keep SYS bp close to 160, Follow neurology lead on BP management pending official note, Get Echo, Start lipitor as was on at home. Hold home atenelol. Again CVA looks more like due to hypoperfusion and hypoxia rather than embolic event. Will give aspirin for now ER:XVQM suggestive of lung CA follow biopsies if done.  -- Aspiration precaution -- Vent protocol on vented patient  -- Was on prednisone at home will start solumedrol -- Continue duoneb ID:No acute issue monitor  ENDO:H/o DM will add SSI hold home metformin -- SSI monitor blood sugar GI:PPI NPO for now but if no improvement in mental status start on NG feeds  RENAL:IV fluids  -- Electrolyte replacement protocol, Monitor Cr and K  GQQ:PYPPJ CVA, Continueous EEG and keppra planned, Further work up and management per neurology. HEMATOLOGY:Monitor hb -- Monitor HB and PLT MUSCULOSKELETAL:No issues monitor CK due to seizures PAIN AND SEDATION: Start propofol and fentanyl prn< IF BP drops change to versed  drip >  Skin/Wound: Chronic changes   Electrolytes: Replace electrolytes per ICU electrolyte replacement protocol.   IVF: none  Nutrition: NPO for now  Prophylaxis: DVT Prophylaxis with SCD start heparin when ok by neurology,. GI Prophylaxis.   Restraints: none  PT/OT eval and treat. OOB when appropriate.   Lines/Tubes:  No  foley  No central line. ADVANCE DIRECTIVE:Full code FAMILY DISCUSSION:no one available Quality Care: PPI, DVT prophylaxis, HOB elevated, Infection control all reviewed and addressed. Events and notes from last 24 hours reviewed. Care plan discussed on multidisciplinary rounds CC TIME:42 min    Subjective: Diane Olson is a 72 year old woman with a history of tobacco abuse, hypertension, and thyroid disease.  She smoked about half a pack a day for 60 years before quitting about a month ago.  She was treated for pneumonia in January.  She completed her course of antibiotics but had a lingering cough.  She did not think too much about this initially but developed hemoptysis.  She went to the emergency room.  A CT of the chest showed a left lower lobe mass with bulky hilar and mediastinal adenopathy.  She was referred to Dr. Julien Nordmann.  He did a PET/CT and MRI of the brain.  The MR showed no evidence of brain metastases.  PET showed the left lower lobe cavitary mass hilar paratracheal and subcarinal lymph nodes were markedly hypermetabolic.  She has had a poor appetite.  She lost about 17 pounds over 3 months although she has gained back about 4 pounds over the past week.  She  has a constant nagging cough.  It is often productive.  She still sees occasional blood.  She also has had a lot of wheezing.   Patient came for EBUS procedure under CT surgery. During procedure patient was found to have CVA. Details of procedure is not available as all notes are not available in system and patient is intubated and sedated. Initial eval suggest hypoxic injury and possible pontine  stroke. Neurology already evaluating patient and seems like patient is also having seizure and was suppose to be started on keppra.   STUDIES:  4/18 CXR LLL more confluent suggest post bronch changes 4/18 MR consistent with widespread hypoxic insult. 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 Slow flow LEFT vertebral artery, potential subclavian steal.Interval growth of subcarinal mass/lymphadenopathy now occluding the LEFT mainstem bronchus. Pneumomediastinum.  3/14 CT Chest Cavitary left lower lobe mass highly suspicious for bronchogenic carcinoma. There is associated central mediastinal lymphadenopathy with mass effect on the tracheobronchial tree and possible tracheal invasion.  CULTURES: none  ANTIBIOTICS: none  SIGNIFICANT EVENTS: 4/18 EBUS -- CVA  LINES/TUBES:   '[]'$ The patient is critically ill on     '[x]'$ Mechanical ventilation '[]'$ Pressors  '[]'$ BiPAP '[]'$      aspirin 324 mg NOW  Or    aspirin 300 mg NOW  chlorhexidine gluconate (MEDLINE KIT) 15 mL BID  ipratropium-albuterol 3 mL Q6H  [START ON Oct 21, 2017] levothyroxine 125 mcg QAC breakfast  LORazepam 1 mg Once  [START ON October 21, 2017] mouth rinse 15 mL QID    ALLERGIES: Allergies  Allergen Reactions  . Amoxicillin-Pot Clavulanate Swelling  . Benazepril Swelling  . Lisinopril     PAST MEDICAL HISTORY:   Past Medical History:  Diagnosis Date  . Anemia    in 20's  . Complication of anesthesia    slow to wake up after hysterectomy  . COPD (chronic obstructive pulmonary disease) ([place])   . Diabetes mellitus without complication ([place])   . History of kidney stones   . Hypertension    no longer on medications since weight loss  . Hypothyroidism   . Pneumonia   . Thyroid disease     PAST SURGICAL HISTORY: Past Surgical History:  Procedure Laterality Date  . ABDOMINAL HYSTERECTOMY    . HEMORRHOIDECTOMY WITH  HEMORRHOID BANDING    . TONSILLECTOMY      PAST SOCIAL HISTORY: Social History   Socioeconomic History  . Marital status: Single    Spouse name: Not on file  . Number of children: Not on file  . Years of education: Not on file  . Highest education level: Not on file  Occupational History  . Not on file  Social Needs  . Financial resource strain: Not on file  . Food insecurity:    Worry: Not on file    Inability: Not on file  . Transportation needs:    Medical: Not on file    Non-medical: Not on file  Tobacco Use  . Smoking status: Former Smoker    Packs/day: 0.50    Years: 60.00    Pack years: 30.00    Last attempt to quit: 09/17/2017    Years since quitting: 0.0  . Smokeless tobacco: Never Used  Substance and Sexual Activity  . Alcohol use: No  . Drug use: No  . Sexual activity: Not on file  Lifestyle  . Physical activity:    Days per week: Not on file  Minutes per session: Not on file  . Stress: Not on file  Relationships  . Social connections:    Talks on phone: Not on file    Gets together: Not on file    Attends religious service: Not on file    Active member of club or organization: Not on file    Attends meetings of clubs or organizations: Not on file    Relationship status: Not on file  . Intimate partner violence:    Fear of current or ex partner: Not on file    Emotionally abused: Not on file    Physically abused: Not on file    Forced sexual activity: Not on file  Other Topics Concern  . Not on file  Social History Narrative  . Not on file    FAMILY HISTORY: Family History  Problem Relation Age of Onset  . Lung cancer Mother     Vital Signs:   BP 110/76   Pulse 94   Temp (!) 96.8 F (36 C)   Resp 15   Ht 5' 3" (1.6 m)   Wt 47.2 kg (104 lb)   SpO2 100%   BMI 18.42 kg/m          Temp (24hrs), Avg:97.4 F (36.3 C), Min:96.8 F (36 C), Max:97.9 F (36.6 C)   Intake/Output:Last shift:      04/18 0701 - 04/18 1900 In: 500  [I.V.:500] Out: 150 Last 3 shifts: No intake/output data recorded.  Intake/Output Summary (Last 24 hours) at 10/08/2017 1803 Last data filed at 09/30/2017 1247 Gross per 24 hour  Intake 500 ml  Output 150 ml  Net 350 ml          Vent Mode: PRVC FiO2 (%):  [50 %-60 %] 50 % Set Rate:  [15 bmp] 15 bmp Vt Set:  [420 mL] 420 mL PEEP:  [5 cmH20] 5 cmH20 Plateau Pressure:  [18 cmH20] 18 cmH20  Objective:   Physical Exam:  General: Intubated and possible twitching HEENT: pupils reactive Neck: No adenopathy or thyroid swelling, no lymphadenopathy or JVD, supple CVS: S1S2 no murmurs RS: Mod AE bilaterally, minimal wheezing Abd: soft, non tender, no hepatosplenomegaly Neuro:intubated not responsive Extrm: no leg edema, clubbing or cyanosis Lymph node: No obvious palpable lymph node appreciated. Skin: no rash  DATA:   Labs:  CBC w/Diff Recent Labs  Lab 10/07/2017 0754  HGB 10.8*  HCT 33.7*  WBC 10.4  PLT 362     Chemistry Recent Labs  Lab 09/29/2017 0754  NA 139  K 3.7  CL 102  CO2 25  BUN 13  CREATININE 0.67  GLUCOSE 109*    Hepatic Function Latest Ref Rng & Units 10/04/2017 09/15/2017 09/01/2017  Total Protein 6.5 - 8.1 g/dL 6.7 7.8 7.7  Albumin 3.5 - 5.0 g/dL 2.5(L) 3.9 4.0  AST 15 - 41 U/L _0 ALT 14 - 54 U/L 38 12 14  Alk Phosphatase 38 - 126 U/L 70 59 54  Total Bilirubin 0.3 - 1.2 mg/dL 0.7 0.4 0.7     Lactic Acid No results found for: LATICACIDVEN   Micro  _1 @ No results found for this or any previous visit (from the past 720 hour(s)).   ABG    Component Value Date/Time   PHART 7.398 10/11/2017 1742   PCO2ART 43.9 10/13/2017 1742   PO2ART 295.0 (H) 10/19/2017 1742   HCO3 27.9 10/02/2017 1742   TCO2 29 10/14/2017 1742   O2SAT 100.0 10/27/2017 1742  Cardiac Enzymes No results for input(s): CKTOTAL, CKMB, TROPONINI, RELINDX in the last 72 hours.    Coagulation Lab Results  Component Value Date   INR 1.13 10/27/2017   APTT 31  10/02/2017     Radiology Results Reviewed by me and compared with previous imaging studies Ct Angio Head W Or Wo Contrast  Result Date: 10/10/2017 CLINICAL DATA:  Follow-up code stroke. History of hypertension and diabetes. EXAM: CT ANGIOGRAPHY HEAD AND NECK TECHNIQUE: Multidetector CT imaging of the head and neck was performed using the standard protocol during bolus administration of intravenous contrast. Multiplanar CT image reconstructions and MIPs were obtained to evaluate the vascular anatomy. Carotid stenosis measurements (when applicable) are obtained utilizing NASCET criteria, using the distal internal carotid diameter as the denominator. CONTRAST:  10m ISOVUE-370 IOPAMIDOL (ISOVUE-370) INJECTION 76% COMPARISON:  MRA of the head and MRA neck October 16, 2017 PE. Recent chest September 01, 2016 FINDINGS: CTA NECK FINDINGS: AORTIC ARCH: Normal appearance of the thoracic arch, normal branch pattern. Moderate calcific atherosclerosis. The origins of the innominate, left Common carotid artery and subclavian artery are patent, though there is severe stenosis LEFT subclavian artery origin. RIGHT CAROTID SYSTEM: Common carotid artery is widely patent, coursing in a straight line fashion. Mild calcific atherosclerosis of carotid bifurcation without hemodynamically significant stenosis by NASCET criteria. Normal appearance of the internal carotid artery. LEFT CAROTID SYSTEM: Common carotid artery is widely patent, coursing in a straight line fashion. Moderate calcific atherosclerosis carotid bifurcation without hemodynamically significant stenosis by NASCET criteria. Normal appearance of the internal carotid artery. VERTEBRAL ARTERIES:Severe stenosis LEFT vertebral artery origin with decreased contrast opacification. RIGHT vertebral artery is dominant. SKELETON: No acute osseous process though bone windows have not been submitted. Severe C4-5 and C5-6 degenerative discs. Focal sclerosis RIGHT a 3 year, RIGHT C4,  bilateral C5, bilateral C6 sclerosis. No OTHER NECK: Soft tissues of the neck are nonacute though, not tailored for evaluation. UPPER CHEST: 5.1 x 4.8 cm subcarinal mass was RIGHT 3 x 5.2 cm. RIGHT hilar lymphadenopathy. There is now focal obstruction LEFT mainstem bronchus. Centrilobular emphysema. Life-support lines in place. New pneumomediastinum tracking into the neck. CTA HEAD FINDINGS: ANTERIOR CIRCULATION: Patent cervical internal carotid arteries, petrous, cavernous and supra clinoid internal carotid arteries. Patent anterior communicating artery. Patent anterior and middle cerebral arteries, mild luminal irregularity compatible with atherosclerosis. No large vessel occlusion, significant stenosis, contrast extravasation or aneurysm. POSTERIOR CIRCULATION: Patent vertebral arteries, vertebrobasilar junction and basilar artery, as well as main branch vessels. Patent posterior cerebral arteries, mild luminal irregularity compatible with atherosclerosis. No large vessel occlusion, significant stenosis, contrast extravasation or aneurysm. VENOUS SINUSES: Major dural venous sinuses are patent though not tailored for evaluation on this angiographic examination. ANATOMIC VARIANTS: None. DELAYED PHASE: No abnormal intracranial enhancement. MIP images reviewed. IMPRESSION: CTA NECK: 1. No hemodynamically significant stenosis of the carotid arteries. 2. 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 Slow flow LEFT vertebral artery, potential subclavian steal. 3. Interval growth of subcarinal mass/lymphadenopathy now occluding the LEFT mainstem bronchus. New pneumomediastinum tracking to neck. 4. Multifocal cervical spine sclerosis equivocal for metastasis. CTA HEAD: 1. No emergent large vessel occlusion or flow limiting stenosis. 2. Mild intracranial atherosclerosis. Critical Value/emergent results text paged to [facility] [employer] secure system on 10/07/2017 at 5:53 pm, including  interpreting physician's phone number. Electronically Signed   By: CElon AlasM.D.   On: 10/13/2017 17:54   Dg Chest 2 View  Result Date: 10/05/2017 CLINICAL DATA:  Preop evaluation for bronchoscopy EXAM: CHEST - 2 VIEW COMPARISON:  09/26/2017, PET-CT, 09/01/2017 chest x-ray FINDINGS: Cardiac shadow is stable. Central left mass lesion is noted similar to that seen on prior CT examinations. Increasing peripheral infiltrative density is noted in the left lower lobe when compare with the prior exam likely related to central bronchial obstruction. IMPRESSION: Persistent central left lower lobe mass with increasing peripheral consolidation. Electronically Signed   By: Inez Catalina M.D.   On: 10/06/2017 08:14   Ct Angio Neck W Or Wo Contrast  Result Date: 09/29/2017 CLINICAL DATA:  Follow-up code stroke. History of hypertension and diabetes. EXAM: CT ANGIOGRAPHY HEAD AND NECK TECHNIQUE: Multidetector CT imaging of the head and neck was performed using the standard protocol during bolus administration of intravenous contrast. Multiplanar CT image reconstructions and MIPs were obtained to evaluate the vascular anatomy. Carotid stenosis measurements (when applicable) are obtained utilizing NASCET criteria, using the distal internal carotid diameter as the denominator. CONTRAST:  65m ISOVUE-370 IOPAMIDOL (ISOVUE-370) INJECTION 76% COMPARISON:  MRA of the head and MRA neck October 16, 2017 PE. Recent chest September 01, 2016 FINDINGS: CTA NECK FINDINGS: AORTIC ARCH: Normal appearance of the thoracic arch, normal branch pattern. Moderate calcific atherosclerosis. The origins of the innominate, left Common carotid artery and subclavian artery are patent, though there is severe stenosis LEFT subclavian artery origin. RIGHT CAROTID SYSTEM: Common carotid artery is widely patent, coursing in a straight line fashion. Mild calcific atherosclerosis of carotid bifurcation without hemodynamically significant stenosis by NASCET  criteria. Normal appearance of the internal carotid artery. LEFT CAROTID SYSTEM: Common carotid artery is widely patent, coursing in a straight line fashion. Moderate calcific atherosclerosis carotid bifurcation without hemodynamically significant stenosis by NASCET criteria. Normal appearance of the internal carotid artery. VERTEBRAL ARTERIES:Severe stenosis LEFT vertebral artery origin with decreased contrast opacification. RIGHT vertebral artery is dominant. SKELETON: No acute osseous process though bone windows have not been submitted. Severe C4-5 and C5-6 degenerative discs. Focal sclerosis RIGHT a 3 year, RIGHT C4, bilateral C5, bilateral C6 sclerosis. No OTHER NECK: Soft tissues of the neck are nonacute though, not tailored for evaluation. UPPER CHEST: 5.1 x 4.8 cm subcarinal mass was RIGHT 3 x 5.2 cm. RIGHT hilar lymphadenopathy. There is now focal obstruction LEFT mainstem bronchus. Centrilobular emphysema. Life-support lines in place. New pneumomediastinum tracking into the neck. CTA HEAD FINDINGS: ANTERIOR CIRCULATION: Patent cervical internal carotid arteries, petrous, cavernous and supra clinoid internal carotid arteries. Patent anterior communicating artery. Patent anterior and middle cerebral arteries, mild luminal irregularity compatible with atherosclerosis. No large vessel occlusion, significant stenosis, contrast extravasation or aneurysm. POSTERIOR CIRCULATION: Patent vertebral arteries, vertebrobasilar junction and basilar artery, as well as main branch vessels. Patent posterior cerebral arteries, mild luminal irregularity compatible with atherosclerosis. No large vessel occlusion, significant stenosis, contrast extravasation or aneurysm. VENOUS SINUSES: Major dural venous sinuses are patent though not tailored for evaluation on this angiographic examination. ANATOMIC VARIANTS: None. DELAYED PHASE: No abnormal intracranial enhancement. MIP images reviewed. IMPRESSION: CTA NECK: 1. No  hemodynamically significant stenosis of the carotid arteries. 2. 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 Slow flow LEFT vertebral artery, potential subclavian steal. 3. Interval growth of subcarinal mass/lymphadenopathy now occluding the LEFT mainstem bronchus. New pneumomediastinum tracking to neck. 4. Multifocal cervical spine sclerosis equivocal for metastasis. CTA HEAD: 1. No emergent large vessel occlusion or flow limiting stenosis. 2. Mild intracranial atherosclerosis. Critical Value/emergent results text paged to [facility] secure system on 10/24/2017 at  5:53 pm, including interpreting physician's phone number. Electronically Signed   By: Elon Alas M.D.   On: 10/07/2017 17:54   Mr Jodene Nam Head Wo Contrast  Result Date: 10/19/2017 CLINICAL DATA:  Hypoperfusion episode during procedure. EXAM: MRI HEAD WITHOUT CONTRAST MRA HEAD WITHOUT CONTRAST MRA NECK WITHOUT CONTRAST TECHNIQUE: Multiplanar, multiecho pulse sequences of the brain and surrounding structures were obtained without intravenous contrast. Angiographic images of the Circle of Willis were obtained using MRA technique without intravenous contrast. Angiographic images of the neck were obtained using MRA technique without intravenous contrast. Carotid stenosis measurements (when applicable) are obtained utilizing NASCET criteria, using the distal internal carotid diameter as the denominator. COMPARISON:  09/26/2017 FINDINGS: MRI HEAD FINDINGS Brain: Diffusion imaging shows a 1.5 cm region of early restricted diffusion in the left pons consistent with infarction. There a few scattered punctate foci of restricted diffusion in the cerebellum. Cerebral hemispheres show widespread areas of cortical restricted diffusion probably in the developing phase most notable in the right posteromedial temporal lobe and occipital lobe, the left posterior parietal lobe and along the gyral surfaces at the  frontoparietal vertices. Findings most consistent with diffuse hypoxic insult. The brain shows a background pattern of chronic small vessel ischemic changes within the deep and subcortical white matter. There could possibly be a small amount of subarachnoid hemorrhage in a left parietal sulcus, not definite. There is scattered punctate foci of hemosiderin deposition within the brain related to old small vessel ischemic insults. Vascular: Major vessels at the base of the brain show flow. Skull and upper cervical spine: Negative Sinuses/Orbits: Clear/normal Other: None MRA HEAD FINDINGS Both internal carotid arteries are widely patent into the brain. No siphon stenosis. The anterior and middle cerebral vessels are patent without proximal stenosis, aneurysm or vascular malformation. Right vertebral artery shows antegrade flow to the basilar. There is diminished flow in the left vertebral artery, possibly retrograde or diminished antegrade. There is abnormal flow in this vessel on the previous study. No basilar stenosis. Posterior circulation branch vessels show flow. Distal branch vessels do show some atherosclerotic irregularity. MRA NECK FINDINGS Both common carotid arteries show flow to the bifurcation. No carotid bifurcation stenosis on either side. Antegrade flow in the dominant right vertebral artery. No antegrade flow seen in the left vertebral artery. IMPRESSION: Findings most consistent with widespread hypoxic insult. 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 Widespread cortical early restricted diffusion which may become more evident over time. Question small amount of subarachnoid hemorrhage in a left parietal sulcus, not definite. Background pattern of chronic small vessel ischemic changes of the white matter. No significant anterior circulation finding on the MR angiogram studies. 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 Retrograde flow demonstrated on the intracranial study.  Dominant right vertebral artery widely patent to the basilar. Electronically Signed   By: Nelson Chimes M.D.   On: 10/22/2017 16:15   Mr Jodene Nam Neck Wo Contrast  Result Date: 10/19/2017 CLINICAL DATA:  Hypoperfusion episode during procedure. EXAM: MRI HEAD WITHOUT CONTRAST MRA HEAD WITHOUT CONTRAST MRA NECK WITHOUT CONTRAST TECHNIQUE: Multiplanar, multiecho pulse sequences of the brain and surrounding structures were obtained without intravenous contrast. Angiographic images of the Circle of Willis were obtained using MRA technique without intravenous contrast. Angiographic images of the neck were obtained using MRA technique without intravenous contrast. Carotid stenosis measurements (when applicable) are obtained utilizing NASCET criteria, using the distal internal carotid diameter as the denominator. COMPARISON:  09/26/2017 FINDINGS: MRI HEAD FINDINGS Brain:  Diffusion imaging shows a 1.5 cm region of early restricted diffusion in the left pons consistent with infarction. There a few scattered punctate foci of restricted diffusion in the cerebellum. Cerebral hemispheres show widespread areas of cortical restricted diffusion probably in the developing phase most notable in the right posteromedial temporal lobe and occipital lobe, the left posterior parietal lobe and along the gyral surfaces at the frontoparietal vertices. Findings most consistent with diffuse hypoxic insult. The brain shows a background pattern of chronic small vessel ischemic changes within the deep and subcortical white matter. There could possibly be a small amount of subarachnoid hemorrhage in a left parietal sulcus, not definite. There is scattered punctate foci of hemosiderin deposition within the brain related to old small vessel ischemic insults. Vascular: Major vessels at the base of the brain show flow. Skull and upper cervical spine: Negative Sinuses/Orbits: Clear/normal Other: None MRA HEAD FINDINGS Both internal carotid arteries are  widely patent into the brain. No siphon stenosis. The anterior and middle cerebral vessels are patent without proximal stenosis, aneurysm or vascular malformation. Right vertebral artery shows antegrade flow to the basilar. There is diminished flow in the left vertebral artery, possibly retrograde or diminished antegrade. There is abnormal flow in this vessel on the previous study. No basilar stenosis. Posterior circulation branch vessels show flow. Distal branch vessels do show some atherosclerotic irregularity. MRA NECK FINDINGS Both common carotid arteries show flow to the bifurcation. No carotid bifurcation stenosis on either side. Antegrade flow in the dominant right vertebral artery. No antegrade flow seen in the left vertebral artery. IMPRESSION: Findings most consistent with widespread hypoxic insult. 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 Widespread cortical early restricted diffusion which may become more evident over time. Question small amount of subarachnoid hemorrhage in a left parietal sulcus, not definite. Background pattern of chronic small vessel ischemic changes of the white matter. No significant anterior circulation finding on the MR angiogram studies. 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 Retrograde flow demonstrated on the intracranial study. Dominant right vertebral artery widely patent to the basilar. Electronically Signed   By: Nelson Chimes M.D.   On: 10/04/2017 16:15   Mri Brain Without Contrast  Result Date: 10/09/2017 CLINICAL DATA:  Hypoperfusion episode during procedure. EXAM: MRI HEAD WITHOUT CONTRAST MRA HEAD WITHOUT CONTRAST MRA NECK WITHOUT CONTRAST TECHNIQUE: Multiplanar, multiecho pulse sequences of the brain and surrounding structures were obtained without intravenous contrast. Angiographic images of the Circle of Willis were obtained using MRA technique without intravenous contrast. Angiographic images of the neck were obtained using MRA  technique without intravenous contrast. Carotid stenosis measurements (when applicable) are obtained utilizing NASCET criteria, using the distal internal carotid diameter as the denominator. COMPARISON:  09/26/2017 FINDINGS: MRI HEAD FINDINGS Brain: Diffusion imaging shows a 1.5 cm region of early restricted diffusion in the left pons consistent with infarction. There a few scattered punctate foci of restricted diffusion in the cerebellum. Cerebral hemispheres show widespread areas of cortical restricted diffusion probably in the developing phase most notable in the right posteromedial temporal lobe and occipital lobe, the left posterior parietal lobe and along the gyral surfaces at the frontoparietal vertices. Findings most consistent with diffuse hypoxic insult. The brain shows a background pattern of chronic small vessel ischemic changes within the deep and subcortical white matter. There could possibly be a small amount of subarachnoid hemorrhage in a left parietal sulcus, not definite. There is scattered punctate foci of hemosiderin deposition within the brain related  to old small vessel ischemic insults. Vascular: Major vessels at the base of the brain show flow. Skull and upper cervical spine: Negative Sinuses/Orbits: Clear/normal Other: None MRA HEAD FINDINGS Both internal carotid arteries are widely patent into the brain. No siphon stenosis. The anterior and middle cerebral vessels are patent without proximal stenosis, aneurysm or vascular malformation. Right vertebral artery shows antegrade flow to the basilar. There is diminished flow in the left vertebral artery, possibly retrograde or diminished antegrade. There is abnormal flow in this vessel on the previous study. No basilar stenosis. Posterior circulation branch vessels show flow. Distal branch vessels do show some atherosclerotic irregularity. MRA NECK FINDINGS Both common carotid arteries show flow to the bifurcation. No carotid bifurcation  stenosis on either side. Antegrade flow in the dominant right vertebral artery. No antegrade flow seen in the left vertebral artery. IMPRESSION: Findings most consistent with widespread hypoxic insult. 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 Widespread cortical early restricted diffusion which may become more evident over time. Question small amount of subarachnoid hemorrhage in a left parietal sulcus, not definite. Background pattern of chronic small vessel ischemic changes of the white matter. No significant anterior circulation finding on the MR angiogram studies. 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 Retrograde flow demonstrated on the intracranial study. Dominant right vertebral artery widely patent to the basilar. Electronically Signed   By: Nelson Chimes M.D.   On: 10/24/2017 16:15      ECHO    Diane Rocker, MD 10/06/2017 _0 @    Diane Olson, M.D. Pulmonary Critical Care & Sleep Medicine

## 2017-10-16 NOTE — Progress Notes (Signed)
Precedex down to 0.76mcg/kg/hr per Dr. Jefm Petty.

## 2017-10-16 NOTE — Progress Notes (Signed)
EEG began showing what appeared to be GPEDs this evening, which is not seizure activity, but is a sign of cortical irritability. I therefore ordered Depakote load. On further review, I think it is possible that these are artifactual and will hold on continuing this.   Continue to monitor.   Roland Rack, MD [facility] [phone]  If 7pm- 7am, please page neurology on call as listed in [place].

## 2017-10-16 NOTE — Progress Notes (Signed)
Received from OR with Precedex at 0.7mcg/kg/hr.

## 2017-10-16 NOTE — Progress Notes (Signed)
Stopped in and visited with family of this patient per nurse request.  Family appropriately tearful.  Daughter at bedside and states that she is not doing well and asked for prayer for her.  We prayed together.  They are hopeful for her to recover but are totally understanding of what's happening.  Will continue to follow up as needed.    10/06/2017 2020  Clinical Encounter Type  Visited With Patient and family together;Health care provider  Visit Type Initial;Spiritual support;Post-op  Spiritual Encounters  Spiritual Needs Prayer;Grief support;Emotional

## 2017-10-16 NOTE — Significant Event (Signed)
Rapid Response Event Note  Overview: Time Called: 1436 Arrival Time: 1500 Event Type: Neurologic  Initial Focused Assessment: Patient sp video bronch.  Post op patient unresponsive on ventilator. MRI and MRA done Patient unresponsive, occasionally posturing. Opens eyes spontaneously,  No response to pain.   BP 125/75  HR 89 O2 sats 100% on Vent 50% Fio2 occasionally breathing over vent.  Interventions: 20ga NSL placed right FA CTA head and neck done  Patient began moving jaw and blinking post CT.  While enroute back to her room she began having focal seizures left face.  Dr Roxan Hockey, Dr Ola Spurr and Dr Rory Percy spoke with family   1mg  ativan x 2 given IV 1000mg  Keppra given IV   Daughters at bedside  Plan of Care (if not transferred):  Event Summary: Name of Physician Notified: Dr Roxan Hockey, Dr Ola Spurr, Dr Rory Percy already aware of patient at    Name of Consulting Physician Notified: CCM MD at    Outcome: Transferred (Comment)(Admitted to [facility])     Raliegh Ip

## 2017-10-16 NOTE — Progress Notes (Signed)
CDS referral per [facility] RN, referral number [phone] call back with rereferral if brain death testing or if family withdraws call back with cardiac time of death

## 2017-10-16 NOTE — Anesthesia Procedure Notes (Signed)
Procedure Name: Intubation Date/Time: 10/12/2017 10:22 AM Performed by: Barrington Ellison, CRNA Pre-anesthesia Checklist: Patient identified, Emergency Drugs available, Suction available and Patient being monitored Patient Re-evaluated:Patient Re-evaluated prior to induction Oxygen Delivery Method: Circle System Utilized Preoxygenation: Pre-oxygenation with 100% oxygen Induction Type: IV induction Ventilation: Mask ventilation without difficulty Laryngoscope Size: Mac and 3 Grade View: Grade I Tube type: Oral Tube size: 8.5 mm Number of attempts: 1 Airway Equipment and Method: Stylet and Oral airway Placement Confirmation: ETT inserted through vocal cords under direct vision,  positive ETCO2 and breath sounds checked- equal and bilateral Secured at: 20 cm Tube secured with: Tape Dental Injury: Teeth and Oropharynx as per pre-operative assessment

## 2017-10-16 NOTE — Brief Op Note (Signed)
10/15/2017  12:56 PM  PATIENT:  Diane Olson  72 y.o. female  PRE-OPERATIVE DIAGNOSIS:  LLL MASS MEDIASTINAL ADENOPATHY  POST-OPERATIVE DIAGNOSIS:  LLL MASS MEDIASTINAL ADENOPATHY  PROCEDURE:  Procedure(s): VIDEO BRONCHOSCOPY WITH ENDOBRONCHIAL ULTRASOUND (N/A) RIGID BRONCHOSCOPY (N/A) ENDOBRONCHIAL BIOPSIES  SURGEON:  Surgeon(s) and Role:    * Melrose Nakayama, MD - Primary  PHYSICIAN ASSISTANT: none  ASSISTANTS: none   ANESTHESIA:   general  EBL:  100 ml  BLOOD ADMINISTERED:none  DRAINS: none   LOCAL MEDICATIONS USED:  NONE  SPECIMEN:  Source of Specimen:  Carina biopsies  DISPOSITION OF SPECIMEN:  PATHOLOGY  COUNTS:  YES  TOURNIQUET:  * No tourniquets in log *  DICTATION: .Other Dictation: Dictation Number -  PLAN OF CARE: Admit for overnight observation  PATIENT DISPOSITION:  PACU - hemodynamically stable.   Delay start of Pharmacological VTE agent (>24hrs) due to surgical blood loss or risk of bleeding: yes

## 2017-10-16 NOTE — Progress Notes (Signed)
Pt arrived from OR to PACU and placed on full vent support.

## 2017-10-16 NOTE — Progress Notes (Signed)
Neuro MD, Dr. Jefm Petty at bedside.

## 2017-10-16 NOTE — Progress Notes (Signed)
Patient transported from 2H15 to CT and back without any complications.

## 2017-10-17 ENCOUNTER — Other Ambulatory Visit: Payer: Self-pay | Admitting: Internal Medicine

## 2017-10-17 ENCOUNTER — Inpatient Hospital Stay (HOSPITAL_COMMUNITY): Payer: Medicare Other

## 2017-10-17 ENCOUNTER — Encounter (HOSPITAL_COMMUNITY): Payer: Self-pay | Admitting: Thoracic Surgery (Cardiothoracic Vascular Surgery)

## 2017-10-17 DIAGNOSIS — Z7189 Other specified counseling: Secondary | ICD-10-CM | POA: Insufficient documentation

## 2017-10-17 DIAGNOSIS — C3402 Malignant neoplasm of left main bronchus: Principal | ICD-10-CM

## 2017-10-17 DIAGNOSIS — C3432 Malignant neoplasm of lower lobe, left bronchus or lung: Secondary | ICD-10-CM | POA: Insufficient documentation

## 2017-10-17 DIAGNOSIS — C3492 Malignant neoplasm of unspecified part of left bronchus or lung: Secondary | ICD-10-CM

## 2017-10-17 DIAGNOSIS — J9601 Acute respiratory failure with hypoxia: Secondary | ICD-10-CM

## 2017-10-17 DIAGNOSIS — G931 Anoxic brain damage, not elsewhere classified: Secondary | ICD-10-CM

## 2017-10-17 LAB — BLOOD GAS, ARTERIAL
Acid-Base Excess: 3.8 mmol/L — ABNORMAL HIGH (ref 0.0–2.0)
Bicarbonate: 29 mmol/L — ABNORMAL HIGH (ref 20.0–28.0)
DRAWN BY: 398981
FIO2: 50
MECHVT: 420 mL
O2 SAT: 98.9 %
PEEP/CPAP: 5 cmH2O
PH ART: 7.353 (ref 7.350–7.450)
PO2 ART: 161 mmHg — AB (ref 83.0–108.0)
Patient temperature: 98.6
RATE: 15 resp/min
pCO2 arterial: 53.5 mmHg — ABNORMAL HIGH (ref 32.0–48.0)

## 2017-10-17 LAB — MAGNESIUM: MAGNESIUM: 1.6 mg/dL — AB (ref 1.7–2.4)

## 2017-10-17 LAB — BASIC METABOLIC PANEL
Anion gap: 14 (ref 5–15)
BUN: 16 mg/dL (ref 6–20)
CALCIUM: 8.9 mg/dL (ref 8.9–10.3)
CO2: 25 mmol/L (ref 22–32)
Chloride: 101 mmol/L (ref 101–111)
Creatinine, Ser: 0.78 mg/dL (ref 0.44–1.00)
GFR calc Af Amer: >60 mL/min (ref >60)
GFR calc non Af Amer: >60 mL/min (ref >60)
GLUCOSE: 156 mg/dL — AB (ref 65–99)
Potassium: 4.3 mmol/L (ref 3.5–5.1)
Sodium: 140 mmol/L (ref 135–145)

## 2017-10-17 LAB — MRSA PCR SCREENING: MRSA BY PCR: POSITIVE — AB

## 2017-10-17 LAB — GLUCOSE, CAPILLARY
GLUCOSE-CAPILLARY: 187 mg/dL — AB (ref 65–99)
GLUCOSE-CAPILLARY: 215 mg/dL — AB (ref 65–99)
Glucose-Capillary: 153 mg/dL — ABNORMAL HIGH (ref 65–99)
Glucose-Capillary: 193 mg/dL — ABNORMAL HIGH (ref 65–99)

## 2017-10-17 LAB — CBC
HEMATOCRIT: 32.1 % — AB (ref 36.0–46.0)
Hemoglobin: 10 g/dL — ABNORMAL LOW (ref 12.0–15.0)
MCH: 28 pg (ref 26.0–34.0)
MCHC: 31.2 g/dL (ref 30.0–36.0)
MCV: 89.9 fL (ref 78.0–100.0)
PLATELETS: 325 10*3/uL (ref 150–400)
RBC: 3.57 MIL/uL — ABNORMAL LOW (ref 3.87–5.11)
RDW: 14.4 % (ref 11.5–15.5)
WBC: 20.2 10*3/uL — AB (ref 4.0–10.5)

## 2017-10-17 LAB — PHOSPHORUS: Phosphorus: 5.7 mg/dL — ABNORMAL HIGH (ref 2.5–4.6)

## 2017-10-17 MED ORDER — MORPHINE 100MG IN NS 100ML (1MG/ML) PREMIX INFUSION
2.0000 mg/h | INTRAVENOUS | Status: DC
  Administered 2017-10-17: 2 mg/h via INTRAVENOUS
  Filled 2017-10-17: qty 100

## 2017-10-17 MED ORDER — MORPHINE 100MG IN NS 100ML (1MG/ML) PREMIX INFUSION
2.0000 mg/h | INTRAVENOUS | Status: DC
Start: 2017-10-17 — End: 2017-10-17

## 2017-10-17 MED ORDER — SODIUM CHLORIDE 0.9 % IV BOLUS
500.0000 mL | Freq: Once | INTRAVENOUS | Status: AC
  Administered 2017-10-17: 500 mL via INTRAVENOUS

## 2017-10-17 MED ORDER — SODIUM CHLORIDE 0.9 % IV SOLN
2.0000 mg/h | INTRAVENOUS | Status: DC
  Filled 2017-10-17: qty 10

## 2017-10-18 ENCOUNTER — Inpatient Hospital Stay (HOSPITAL_COMMUNITY): Payer: Medicare Other

## 2017-10-18 NOTE — Procedures (Signed)
LTM-EEG Report  HISTORY: Continuous video-EEG monitoring performed for 73 year old with multifocal posterior circulation infarcts, altered mental status. ACQUISITION: International 10-20 system for electrode placement; 18 channels with additional eyes linked to ipsilateral ears and EKG. Additional T1-T2 electrodes were used. Continuous video recording obtained.   EEG NUMBER:  MEDICATIONS:  Day 1:  LEV, VPA Day 2: LEV, VPA  DAY #1: from 1808 10/08/2017 to 0730 10-30-2017  BACKGROUND: An overall low-medium voltage continuous recording with poor spontaneous variability and reactivity. The record consisted of low-medium voltage, irregular 1-4Hz  activity with some superimposed beta activity in the bilateral frontocentral regions. No clear posterior dominant rhythm or sleep architecture was seen. Reactivity was present. There was frequent artifact from EMG and lateral/vertical eye movement throughout the recording.  EPILEPTIFORM/PERIODIC ACTIVITY: No periodic discharges were present (previously identified possible periodic discharges from overnight appeared to be artifactual from eye movement and EMG) SEIZURES: none EVENTS: none  DAY #2: from 0730 10-30-2017 to 2130 10/30/2017  BACKGROUND: An overall low-medium voltage continuous recording initially with poor spontaneous variability and reactivity, later showing electrocerebral inactivity in the afternoon and ultimately cardiac arrest at 2125. The early record consisted of low-medium voltage, irregular 1-4Hz  activity with some superimposed beta activity in the bilateral frontocentral regions. In the late morning, multifocal electrographic seizures emerged from the left and right hemispheres with brief evolving rhythmic 3-4hz  discharges. These did not improve with additional AEDs. Around 1530, seizures resolved with progressive attenuation of the background, now showing diffuse voltage attenuation without clear intervening electrical cortical activity.   EKG:  Bradycardia then eventual cardiac arrest on EKG  SUMMARY: This was an abnormal continuous video EEG due to emergence of multifocal electrographic seizures in the afternoon and eventual electrical cortical inactivity correlating with a herniation event and later cardiac death.

## 2017-10-18 NOTE — Progress Notes (Signed)
80 mL of morphine wasted in sink with Copy as witness

## 2017-10-20 ENCOUNTER — Ambulatory Visit: Payer: Medicare Other | Admitting: Radiation Oncology

## 2017-10-21 ENCOUNTER — Ambulatory Visit: Payer: Medicare Other

## 2017-10-22 ENCOUNTER — Ambulatory Visit: Payer: Medicare Other

## 2017-10-23 ENCOUNTER — Ambulatory Visit: Payer: Medicare Other

## 2017-10-24 ENCOUNTER — Ambulatory Visit: Payer: Medicare Other

## 2017-10-27 ENCOUNTER — Ambulatory Visit: Payer: Medicare Other

## 2017-10-28 ENCOUNTER — Ambulatory Visit: Payer: Medicare Other

## 2017-10-29 ENCOUNTER — Ambulatory Visit: Payer: Medicare Other

## 2017-10-29 NOTE — Progress Notes (Signed)
START ON PATHWAY REGIMEN - Non-Small Cell Lung     Administer weekly:     Paclitaxel      Carboplatin   **Always confirm dose/schedule in your pharmacy ordering system**    Patient Characteristics: Stage III - Unresectable, PS = 0, 1 AJCC T Category: T2a Current Disease Status: No Distant Mets or Local Recurrence AJCC N Category: N2 AJCC M Category: M0 AJCC 8 Stage Grouping: IIIA Performance Status: PS = 0, 1 Intent of Therapy: Curative Intent, Discussed with Patient

## 2017-10-29 NOTE — Progress Notes (Signed)
vLTM EEG maint complete. Continue to monitor 

## 2017-10-29 NOTE — Progress Notes (Signed)
Pt transported to CT and back via vent with current settings with 100% Oxygen. Pt suctioned before and after transport. Pt tol well. No complications. Pt placed back on current settings of 40% oxygen. HOB at 30 degrees.

## 2017-10-29 NOTE — Progress Notes (Addendum)
72 year old ex-smoker Presenting with left lower lobe mass and bulky hilar mediastinal adenopathy.  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 She is intubated and unresponsive, pupils mid dilated and not reactive to light.  Twitching movements and chewing movements of lower jaw Long-term EEG ongoing and results noted over last 24 hours. Daughters at bedside. Decreased breath sounds on the left, clear on the right, S1-S2 tacky, on amiodarone drip.  Chest x-ray reviewed which shows left lower lobe mass  Impression/plan Acute respiratory failure-postoperatively related to neurological status Clinical exam, MRI unfortunately suggestive of anoxic injury and acute left brainstem stroke.  Neurology helping to manage seizures with antiepileptics.  Daughters confirm that she would not want long-term life support and agreed to limited CODE STATUS based on patient's prior known wishes. We will write this order if primary service in agreement, otherwise continue current care while awaiting neuro prognostication  My critical care time x 38mins  Mysty Kielty V. Elsworth Soho MD [phone]

## 2017-10-29 NOTE — Progress Notes (Signed)
Patient's last heartbeat was at 2125.  Myself and Minus Liberty RN confirmed the absence of S1 and S2 and the absence of lung sounds.  Patient is surrounded by her loving family and they seem very at peace with her passing.

## 2017-10-29 NOTE — Progress Notes (Signed)
   11-04-17 1900  Clinical Encounter Type  Visited With Patient and family together  Visit Type Follow-up;Spiritual support  Referral From Nurse  Spiritual Encounters  Spiritual Needs Emotional;Grief support;Prayer  Chaplain was called to the PT room from nurse page.  The nurse indicated that the family got an imminent diagnosis and an extubation will be taking place at some point.  Several generations present in the room.  There is a sense of quiet peace but also anxiety and frustration.  The family is engaging in conversation but Diane Olson has identified the chattiness as a way to cope.  Chaplain spent 2 hours with family.

## 2017-10-29 NOTE — Progress Notes (Signed)
Results for MAKENLEIGH, CROWNOVER (MRN [id number]) as of 11-11-17 13:06  Ref. Range 10/26/2017 08:17 10/22/2017 13:03 11-11-17 04:51 11/11/17 07:35 11-11-17 12:13  Glucose-Capillary Latest Ref Range: 65 - 99 mg/dL 98 170 (H) 153 (H) 187 (H) 215 (H)  Noted rising blood sugars. If patient's blood sugars continue to rise and have 2 blood sugars >200 mg/dl or 1 blood sugar >250 mg/dl, consider adding Phase 1 of the ICU hyperglycemia protocol to regimen. Will continue to monitor blood sugars while in the hospital.  Harvel Ricks RN BSN CDE Diabetes Coordinator Pager: [phone]  8am-5pm

## 2017-10-29 NOTE — Progress Notes (Signed)
Patient extubated at this time per MD order. Placed on a 4 Lpm nasal cannula for comfort per MD order. RN and family at bedside. Patient in no distress.

## 2017-10-29 NOTE — Progress Notes (Signed)
1 Day Post-Op Procedure(s) (LRB): RIGID BRONCHOSCOPY (N/A) VIDEO BRONCHOSCOPY WITH BIOPSIES Subjective: Intubated, facial twitching  Objective: Vital signs in last 24 hours: Temp:  [93.6 F (34.2 C)-99.1 F (37.3 C)] 98.8 F (37.1 C) (04/19 0700) Pulse Rate:  [93-149] 126 (04/19 0700) Cardiac Rhythm: Supraventricular tachycardia (04/19 0400) Resp:  [15-35] 17 (04/19 0700) BP: (82-167)/(67-117) 90/75 (04/19 0700) SpO2:  [92 %-100 %] 97 % (04/19 0700) FiO2 (%):  [50 %-60 %] 50 % (04/19 0434) Weight:  [112 lb 14 oz (51.2 kg)] 112 lb 14 oz (51.2 kg) (04/19 0500)  Hemodynamic parameters for last 24 hours:    Intake/Output from previous day: 04/18 0701 - 04/19 0700 In: 1630.3 [I.V.:1130.3; IV Piggyback:500] Out: 1200 [Urine:650; Emesis/NG output:400; Blood:150] Intake/Output this shift: No intake/output data recorded.  General appearance: intubated Neurologic: pupils 3 mm sluggish, facial twithcing- myoclonic Heart: tachy, regular Lungs: clear to auscultation bilaterally  Lab Results: Recent Labs    09/30/2017 1728 11/05/2017 0226  WBC 22.9* 20.2*  HGB 9.5* 10.0*  HCT 30.5* 32.1*  PLT 301 325   BMET:  Recent Labs    10/05/2017 1728 11/05/17 0226  NA 137 140  K 3.9 4.3  CL 102 101  CO2 23 25  GLUCOSE 203* 156*  BUN 12 16  CREATININE 0.74 0.78  CALCIUM 8.7* 8.9    PT/INR:  Recent Labs    10/07/2017 1728  LABPROT 14.7  INR 1.16   ABG    Component Value Date/Time   PHART 7.353 2017-11-05 0447   HCO3 29.0 (H) 2017/11/05 0447   TCO2 29 10/09/2017 1742   O2SAT 98.9 11-05-2017 0447   CBG (last 3)  Recent Labs    10/26/2017 1303 05-Nov-2017 0451 05-Nov-2017 0735  GLUCAP 170* 153* 187*    Assessment/Plan: S/P Procedure(s) (LRB): RIGID BRONCHOSCOPY (N/A) VIDEO BRONCHOSCOPY WITH BIOPSIES -CVA, probable hypoxic injury. Unresponsive, epileptiform activity on EEG  RESP- Advanced stage lung cancer with invasion of trachea and left main stem bronchus  Prognosis  poor  CV_ tachycardic, started on amiodarone overnight. Looks like sinus tachy on ECG  Leukocytosis- down slightly from yesterday. On empiric antibiotics  SCD for DVT prophylaxis. No enoxaparin due to airway bleeding   LOS: 1 day    Melrose Nakayama 11-05-17

## 2017-10-29 NOTE — Procedures (Signed)
LTM-EEG Report  HISTORY: Continuous video-EEG monitoring performed for 72 year old with multifocal posterior circulation infarcts, altered mental status. ACQUISITION: International 10-20 system for electrode placement; 18 channels with additional eyes linked to ipsilateral ears and EKG. Additional T1-T2 electrodes were used. Continuous video recording obtained.   EEG NUMBER:  MEDICATIONS:  Day 1:  LEV, VPA  DAY #1: from 1808 10/11/2017 to 0730 10/27/17   BACKGROUND: An overall low-medium voltage continuous recording with poor spontaneous variability and reactivity. The record consisted of low-medium voltage, irregular 1-4Hz  activity with some superimposed beta activity in the bilateral frontocentral regions. No clear posterior dominant rhythm or sleep architecture was seen. Reactivity was present. There was frequent artifact from EMG and lateral/vertical eye movement throughout the recording.  EPILEPTIFORM/PERIODIC ACTIVITY: No periodic discharges were present (previously identified possible periodic discharges from overnight appeared to be artifactual from eye movement and EMG) SEIZURES: none EVENTS: none  EKG: no significant arrhythmia  SUMMARY: This was an abnormal continuous video EEG due to loss of normal background features and generalized slowing, indicative of a diffuse cerebral disturbance. There were no epileptiform discharges or seizures. Previously identified possible periodic activity appeared to be artifactual in etiology.

## 2017-10-29 NOTE — Progress Notes (Signed)
[address]       [place] [address]             [phone]      I spoke with Dr. Rory Percy and he updated me on the new neurologic findings  She has brainstem herniation. No hope at this point for a meaningful recovery.   I met with Diane Olson's family and they are in agreement that she would not want to be maintained in this state. They have requested terminal extubation and comfort measures, but want to hold off on extubating until another family member arrives in the next couple of hours.  Will proceed per the patient and family's expressed wishes  Will start a morphine drip  Extubate when all family members have arrived  [place]. Roxan Hockey, MD [facility] [phone]

## 2017-10-29 NOTE — Progress Notes (Addendum)
SAME DAY PROGRESS NOTE  Seen and examined the patient. Right pupil 5 mm nonreactive.  Left pupil 4 mm nonreactive.  Patient is barely breathing above the vent.  No purposeful movement noted.  Still shows extensor posturing to noxious stim and lower extremities.  Disconjugate gaze.  Her EEG also became very flat after she had some seizure activity at 11:30 AM, brief seizure of 30 seconds and some of the seizure activity from the left side of the brain until about 2 PM after which the EEG became is very nonreactive.  Taken in for a stat CT of the head that showed diffuse cerebral edema and downward brainstem herniation. See formal report below. IMPRESSION: 1. Significant interval change with diffuse bilateral supratentorial cerebral edema and mass effect including effacement of the sulci bilaterally and partial effacement of the lateral ventricles. 2. Downward herniation of the brainstem with effacement of the para mesencephalic cisterns cerebellar tonsils are now at the foramen magnum. 3. No acute hemorrhage. 4. Preserved cortical signal in the cerebellum.  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 This would likely be non survuvable and progress to brain death  RECS: Patient had, premorbidly expressed explicit desire that if her condition becomes incompatible with life, no life sustaining measures be done.  She is already a limited code.   At this time I think it would be reasonable to pursue withdrawal of care and pursue comfort measures only.  I have communicated my plan to the primary Dr. Roxan Hockey.  Please call with questions.  -- Amie Portland, MD [facility] Pager: [phone] If 7pm to 7am, please call on call as listed on [facility].

## 2017-10-29 NOTE — Discharge Summary (Signed)
Physician Discharge Summary  Patient ID: Dazaria Macneill MRN: [id number] DOB/AGE: January 18, 1946 72 y.o.  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 In addition she had persistent cough, poor appetite with a 17 pound weight loss. Ct showed a left lower lobe lung mass with bulky adenopathy and likely airway invasion. PET showed the lesion were hypermetabolic. She was referred for bronchoscopy for diagnosis. The indications, risks, benefits and alternatives were discussed with the patient and she agreed to proceed.  She underwent flexible and rigid bronchoscopy on 10/02/2017. With the flexible scope there was too much blood and clot in the airway to do a biopsy. Rigid bronchoscopy with jet ventilation was performed to evacuate the clot. There was new bleeding with passage of the rigid scope. Ultimately, the clot was cleared, but she became bradycardic and hypotensive. Saturation wave form was lost while hypotensive, but was 100% prior to that. The patient was reintubated and resuscitated and had a improvement of her HR and BP within a minutes. Oxygen saturation was 100% once the waveform returned. The blood was cleared and biopsies showed squamous cell carcinoma. She was taken to the PACU intubated with the intention of weaning her there. She was noted to be posturing. She was taken for urgent head CT and Neurology was consulted. Ct showed a pontine CVA and diffuse ischemic  changes. She was admitted to the ICU for supportive care. The following day she was noted to have some seizure activity, her respiratory rate decreased and her pupils were noted to be unequal. She was taken for a repeat head CT which showed herniation. Per the patient's and family's wishes comfort measures were instituted and she was extubated. She expired at 9:25 PM  Consults: neurology, critical care medicine  Significant Diagnostic Studies: radiology: CT scan: Head  Treatments: IV hydration, antibiotics: -, respiratory therapy: mechanical ventilation and surgery: bronchoscopy  Discharge Exam: Blood pressure (!) 75/61, pulse (!) 0, temperature (!) 95.1 F (35.1 C), resp. rate (!) 0, height 5\' 3"  (1.6 m), weight 112 lb 14 oz (51.2 kg), SpO2 (!) 18 %. expired  Disposition: expired   Allergies as of 10/18/2017      Reactions   Amoxicillin-pot Clavulanate Swelling   Benazepril Swelling   Lisinopril       Medication List    ASK your doctor about these medications   albuterol (2.5 MG/3ML) 0.083% nebulizer solution Commonly known as:  PROVENTIL Take 3 mLs (2.5 mg total) by nebulization every 6 (six) hours as needed for wheezing or shortness of breath.   levothyroxine 125 MCG tablet Commonly known as:  SYNTHROID, LEVOTHROID Take 125 mcg by mouth daily before breakfast.        Signed: Melrose Nakayama 10/29/2017, 4:24 PM

## 2017-10-29 NOTE — Progress Notes (Signed)
Initial Nutrition Assessment  DOCUMENTATION CODES:   Non-severe (moderate) malnutrition in context of acute illness/injury  INTERVENTION:  - If TF warranted, recommend: Vital AF 1.2 @ 20 mL/hr to advance by 10 mL every 8 hours to reach goal rate of Vital AF 1.2 @ 40 mL/hr.  - At goal rate, this regimen + kcal from current Propofol rate would provide 1226 kcal (101% estimated kcal need), 72 grams of protein, and 778 mL free water.  Monitor magnesium, potassium, and phosphorus daily for at least 3 days, MD to replete as needed, as pt is at risk for refeeding syndrome given malnutrition, decreased appetite and intakes PTA.   NUTRITION DIAGNOSIS:   Moderate Malnutrition related to acute illness, catabolic illness, cancer and cancer related treatments as evidenced by mild fat depletion, moderate muscle depletion.  GOAL:   Patient will meet greater than or equal to 90% of their needs  MONITOR:   Vent status, Weight trends, Labs  REASON FOR ASSESSMENT:   Ventilator, Low Braden  ASSESSMENT:   72 year old woman with a history of tobacco abuse, HTN, and thyroid disease. She smoked about half a pack a day for 60 years before quitting about a month ago. She was treated for pneumonia in January. She completed her course of antibiotics but had a lingering cough and then developed hemoptysis. Around that time, CT of the chest showed a left lower lobe mass with bulky hilar and mediastinal adenopathy. She was referred to Dr. Julien Nordmann.  She underwent PET/CT and MRI of the brain. The MR showed no evidence of brain metastases.  PET showed the left lower lobe cavitary mass hilar paratracheal and subcarinal lymph nodes were markedly hypermetabolic. She presented outpatient on 4/18 and underwent rigid bronchoscopy with endobronchial ultrasound and endobronchial biopsies. When taken to PACU she was noted to have abnormal extensor posturing. Pt remains intubated and Neuro following.   BMI indicates normal  weight. Pt intubated s/p bronch and biopsy; OGT in place with 400 mL out overnight. Daughter and granddaughter are at bedside. Daughter reports that pt's appetite began decreasing sometime before January and that she was dx with cancer in January. Since that time pt has continued to have a poor appetite but has been trying to keep her weight up. She did not like Ensure but would try to drink 1-2/day.   Daughter reports that pt's UBW was ~118 lbs but she is unsure when she last weighed this. She reports pt's lowest weight was 99 lbs. Estimated nutrition needs based on weight from 10/13/17 (104 lb/ 47.5 kg) as daughter feels confident that current weight is too high based on prior trends. From chart review, it appears that pt lost 4 lbs (3.7% body weight) from 09/15/17-10/13/17; this is not significant for time frame.  She had 24-hour EEG and per Neurologist's note this AM, EEG found to be abnormal with indicative diffuse cerebral disturbance. Dr. Bari Mantis note from this AM states that MRI suggestive of anoxic brain injury and acute L brainstem stroke. Pending Neuro prognostication.   Patient is currently intubated on ventilator support MV: 7.7 L/min Temp (24hrs), Avg:98.1 F (36.7 C), Min:93.6 F (34.2 C), Max:99.7 F (37.6 C) Propofol: 2.8 ml/hr (74 kcal) BP: 112/88 and MAP: 96  Medications reviewed; 20 mg IV Pepcid BID, 125 mcg Synthroid per OGT/day, 40 mg Solu-medrol BID. Labs reviewed; CBGs: 153 and 187 mg/dL this AM, Phos: 5.7 mg/dL, Mg: 1.6 mg/dL.  IVF: D5-NS @ 50 mL/hr (204 kcal). Drips: Amiodarone @ 30 mg/hr, Propofol @ 10  mcg/kg/min.     NUTRITION - FOCUSED PHYSICAL EXAM:    Most Recent Value  Orbital Region  Unable to assess  Upper Arm Region  Mild depletion  Thoracic and Lumbar Region  Unable to assess  Buccal Region  Mild depletion  Temple Region  Mild depletion  Clavicle Bone Region  Moderate depletion  Clavicle and Acromion Bone Region  Moderate depletion  Scapular Bone  Region  Unable to assess  Dorsal Hand  Moderate depletion  Patellar Region  Mild depletion  Anterior Thigh Region  Moderate depletion  Posterior Calf Region  Moderate depletion  Edema (RD Assessment)  None  Hair  Reviewed  Eyes  Unable to assess  Mouth  Unable to assess  Skin  Reviewed  Nails  Reviewed       Diet Order:  Diet NPO time specified  EDUCATION NEEDS:   No education needs have been identified at this time  Skin:  Skin Assessment: Reviewed RN Assessment  Last BM:  PTA/unknown  Height:   Ht Readings from Last 1 Encounters:  10/20/2017 5\' 3"  (1.6 m)    Weight:   Wt Readings from Last 1 Encounters:  2017-11-11 112 lb 14 oz (51.2 kg)    Ideal Body Weight:  52.27 kg  BMI:  Body mass index is 20 kg/m.  Estimated Nutritional Needs:   Kcal:  1217  Protein:  71-85 grams (1.5-1.8 grams/kg)  Fluid:  >/= 1.5 L/day       Jarome Matin, MS, RD, LDN, [facility] Inpatient Clinical Dietitian Pager # [phone] After hours/weekend pager # [phone]

## 2017-10-29 NOTE — Progress Notes (Signed)
Neurology Progress Note   S:// Patient seen and examined in the ICU.  Remains intubated.  Overnight had tachycardia requiring amiodarone drip to be started. On sedation with propofol at 15 mics an hour   O:// Current vital signs: BP 95/77   Pulse (!) 132   Temp 99 F (37.2 C)   Resp 19   Ht 5' 3"  (1.6 m)   Wt 51.2 kg (112 lb 14 oz)   SpO2 96%   BMI 20.00 kg/m  Vital signs in last 24 hours: Temp:  [93.6 F (34.2 C)-99.1 F (37.3 C)] 99 F (37.2 C) (04/19 0732) Pulse Rate:  [93-149] 132 (04/19 0732) Resp:  [15-35] 19 (04/19 0732) BP: (82-167)/(67-117) 95/77 (04/19 0732) SpO2:  [92 %-100 %] 96 % (04/19 0732) FiO2 (%):  [40 %-60 %] 40 % (04/19 0732) Weight:  [51.2 kg (112 lb 14 oz)] 51.2 kg (112 lb 14 oz) (04/19 0500) General: Sedated intubated HEENT: Normocephalic, atraumatic CVS: S1-S2 heard, tachycardic Respiratory: Scattered rales Abdomen: Nondistended nontender Extremities: Warm well perfused Neurological exam Patient is sedated on propofol which was turned off temporarily for the exam. She remains intubated. She is currently breathing over the ventilator. She has no spontaneous movements. She does not open eyes to voice or noxious stim. Cranial nerves: Pupils are 3 mm minimally reactive, left eye exotropia and both eyes slightly downward deviated, corneal reflexes present.  Oculocephalics absent. Motor: No spontaneous movement.  Extensor posturing to noxious stimulus in upper extremities.  No movement to noxious stim on lower extremities. Sensory: As above Unable to assess coordination or gait. DTRs are mute.  No clonus noted today.  Plantars are mute today.  Medications  Current Facility-Administered Medications:  .  0.9 %  sodium chloride infusion, 250 mL, Intravenous, PRN, Lahoma Rocker, MD .  acetaminophen (TYLENOL) tablet 650 mg, 650 mg, Oral, Q6H PRN, Melrose Nakayama, MD .  albuterol (PROVENTIL) (2.5 MG/3ML) 0.083% nebulizer solution 2.5 mg, 2.5 mg,  Nebulization, Q6H PRN, Melrose Nakayama, MD .  amiodarone (NEXTERONE PREMIX) 360-4.14 MG/200ML-% (1.8 mg/mL) IV infusion, 30 mg/hr, Intravenous, Continuous, Prescott Gum, Collier Salina, MD, Last Rate: 16.7 mL/hr at Nov 13, 2017 0700, 30 mg/hr at 11/13/2017 0700 .  aspirin chewable tablet 324 mg, 324 mg, Oral, NOW **OR** aspirin suppository 300 mg, 300 mg, Rectal, NOW, Manuella Ghazi, Rutul, MD .  chlorhexidine gluconate (MEDLINE KIT) (PERIDEX) 0.12 % solution 15 mL, 15 mL, Mouth Rinse, BID, Melrose Nakayama, MD, 15 mL at 2017-11-13 0752 .  dextrose 5 %-0.9 % sodium chloride infusion, , Intravenous, Continuous, Lahoma Rocker, MD, Last Rate: 50 mL/hr at Nov 13, 2017 0700 .  diphenhydrAMINE (BENADRYL) capsule 25 mg, 25 mg, Oral, QHS PRN, Melrose Nakayama, MD .  famotidine (PEPCID) IVPB 20 mg premix, 20 mg, Intravenous, Q12H, Lahoma Rocker, MD, Stopped at 10/28/2017 2330 .  fentaNYL (SUBLIMAZE) injection 50 mcg, 50 mcg, Intravenous, Q2H PRN, Lahoma Rocker, MD .  gi cocktail (Maalox,Lidocaine,Donnatal), 30 mL, Oral, TID PRN, Melrose Nakayama, MD .  iopamidol (ISOVUE-370) 76 % injection 50 mL, 50 mL, Intravenous, Once PRN, Melrose Nakayama, MD .  ipratropium-albuterol (DUONEB) 0.5-2.5 (3) MG/3ML nebulizer solution 3 mL, 3 mL, Nebulization, Q6H PRN, Melrose Nakayama, MD .  lactated ringers infusion, , Intravenous, Continuous, Melrose Nakayama, MD, Last Rate: 10 mL/hr at Nov 13, 2017 0700 .  levETIRAcetam (KEPPRA) IVPB 1000 mg/100 mL premix, 1,000 mg, Intravenous, Q12H, Greta Doom, MD, Stopped at 11/13/2017 [phone] .  levofloxacin (LEVAQUIN) IVPB 500 mg, 500 mg, Intravenous, Q24H,  Melrose Nakayama, MD, Stopped at 09/29/2017 2200 .  levothyroxine (SYNTHROID, LEVOTHROID) tablet 125 mcg, 125 mcg, Oral, QAC breakfast, Melrose Nakayama, MD .  magnesium hydroxide (MILK OF MAGNESIA) suspension 15 mL, 15 mL, Oral, Daily PRN, Melrose Nakayama, MD .  MEDLINE mouth rinse, 15 mL, Mouth Rinse, 10 times per day,  Melrose Nakayama, MD, 15 mL at 2017/10/19 0600 .  methylPREDNISolone sodium succinate (SOLU-MEDROL) 40 mg/mL injection 40 mg, 40 mg, Intravenous, Q12H, Lahoma Rocker, MD, 40 mg at 10/19/17 0653 .  metroNIDAZOLE (FLAGYL) IVPB 500 mg, 500 mg, Intravenous, Q8H, Melrose Nakayama, MD, Stopped at October 19, 2017 [phone] .  ondansetron (ZOFRAN) injection 4 mg, 4 mg, Intravenous, Q6H PRN, Lahoma Rocker, MD .  propofol (DIPRIVAN) 1000 MG/100ML infusion, 5-80 mcg/kg/min, Intravenous, Titrated, Lahoma Rocker, MD, Stopped at October 19, 2017 0755 Labs CBC    Component Value Date/Time   WBC 20.2 (H) 10/19/2017 0226   RBC 3.57 (L) 19-Oct-2017 0226   HGB 10.0 (L) 2017/10/19 0226   HCT 32.1 (L) October 19, 2017 0226   PLT 325 October 19, 2017 0226   PLT 287 09/26/2017 1333   MCV 89.9 19-Oct-2017 0226   MCH 28.0 10/19/17 0226   MCHC 31.2 19-Oct-2017 0226   RDW 14.4 19-Oct-2017 0226   LYMPHSABS 0.6 (L) 09/26/2017 1333   MONOABS 1.1 (H) 09/26/2017 1333   EOSABS 0.0 09/26/2017 1333   BASOSABS 0.0 09/26/2017 1333    CMP     Component Value Date/Time   NA 140 19-Oct-2017 0226   K 4.3 Oct 19, 2017 0226   CL 101 19-Oct-2017 0226   CO2 25 2017/10/19 0226   GLUCOSE 156 (H) 19-Oct-2017 0226   BUN 16 19-Oct-2017 0226   CREATININE 0.78 10/19/2017 0226   CREATININE 0.89 09/15/2017 1403   CALCIUM 8.9 10-19-17 0226   PROT 6.0 (L) 10/24/2017 1728   ALBUMIN 2.7 (L) 10/15/2017 1728   AST 45 (H) 10/10/2017 1728   AST 11 09/15/2017 1403   ALT 35 10/04/2017 1728   ALT 12 09/15/2017 1403   ALKPHOS 64 10/18/2017 1728   BILITOT 1.0 10/04/2017 1728   BILITOT 0.4 09/15/2017 1403   GFRNONAA >60 October 19, 2017 0226   GFRNONAA >60 09/15/2017 1403   GFRAA >60 10-19-17 0226   GFRAA >60 09/15/2017 1403   Imaging I have reviewed images in epic and the results pertinent to this consultation are:  MRI examination of the brain IMPRESSION: Findings most consistent with widespread hypoxic insult. 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  Widespread cortical early restricted diffusion which may become more evident over time. Question small amount of subarachnoid hemorrhage in a left parietal sulcus, not definite.  Background pattern of chronic small vessel ischemic changes of the white matter.  No significant anterior circulation finding on the MR angiogram studies.  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 Retrograde flow demonstrated on the intracranial study. Dominant right vertebral artery widely patent to the basilar.  CTA Head/neck IMPRESSION: CTA NECK:  1. No hemodynamically significant stenosis of the carotid arteries. 2. 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 Slow flow LEFT vertebral artery, potential subclavian steal. 3. Interval growth of subcarinal mass/lymphadenopathy now occluding the LEFT mainstem bronchus. New pneumomediastinum tracking to neck. 4. Multifocal cervical spine sclerosis equivocal for metastasis.  CTA HEAD:  1. No emergent large vessel occlusion or flow limiting stenosis. 2. Mild intracranial atherosclerosis.  Assessment:  72 year old woman with mediastinal lymphadenopathy and a tracheal mass, who came in for a  tracheal mass biopsy with flexible and rigid bronchoscopy yesterday with complicated By operative bleeding, hypotension followed by posturing on exam. MRI scan of the brain shows evidence of widespread hypoxic injury with acute infarction on the left pons and widespread only restricted diffusion in the cortex.  She also started having clinical events concerning for seizure activity but the preliminary read on the EEG is not suggestive of electrographic seizures and shows generalized periodic epileptiform discharges without frank seizures.  Official read of the EEG is pending. At this time, given the widespread extent of the cortical insult and the brainstem stroke along with a poor neurological exam and, her  comorbidities including the large lymphadenopathy in the mediastinum and the tracheal mass, her prognosis is not likely to be very good although it is too early to make any long-term predictions. I would reevaluate her in 24 hours for more substantial input.  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  Will await formal EEG reading to make a decision on adding another antiepileptic as necessary. Continue the stroke workup as ordered yesterday. Will obtain repeat head CT tomorrow, or sooner if clinical exam deteriorates. Continue with the LTM EEG Contains seizure precautions Supportive care and management per primary team. Patient has had problems with hypotension, I would defer management of this per the primary team.  From neurology and stroke perspective, I would not like her to be hypotensive and I would not treat her blood pressure with antihypertensives unless the systolics are greater than 220 given the acute stroke. Plan was relayed to Dr. Roxan Hockey at the bedside.  Plan was also discussed with patient's daughters at bedside.  I have answered all their questions.  I will be available should any other questions arise. Neurology service will continue to follow the patient with you.  -- Amie Portland, MD [facility] Pager: [phone] If 7pm to 7am, please call on call as listed on AMION.   CRITICAL CARE ATTESTATION This patient is critically ill and at significant risk of neurological worsening, death and care requires constant monitoring of vital signs, hemodynamics,respiratory and cardiac monitoring. I spent 50  minutes of neurocritical care time performing neurological assessment, discussion with family, other specialists and medical decision making of high complexityin the care of  this patient.

## 2017-10-29 NOTE — Progress Notes (Signed)
Patient terminally extubated on 2 mg morphine gtt and and 50 mcg of fentanyl given for comfort.  Family at bedside. RN will continue to monitor patient.

## 2017-10-29 NOTE — Progress Notes (Signed)
[facility]  ICU Patient Progress Note  Name: Diane Olson  DOB: [birth year]  MRN: [id number]  Date: 2017/11/16   '[x]'$ I have reviewed the flowsheet and previous day's notes.  IMPRESSION:  Patient Prior Problem List   Diagnosis Date Noted  . Lung cancer, main bronchus, left ([place]) 10/02/2017  . Status post surgery 10/08/2017  . Malignant neoplasm of bronchus and lung ([place]) 09/30/2017  . Cavitating mass of lower lobe of left lung 09/15/2017  . Encounter for smoking cessation counseling  Acute issues: Acute respiratory failure, Nov 16, 2017 does not weanable at this time. Pontine CVA with diffuse hypoxic injury Questionable seizure activity being treated by neurology with antiseizure medications Lung mass most likely cancerous awaiting pathology Suspected COPD Reported pneumomediastinum -cardiovascular thoracic surgery is managing 09/15/2017     PLAN:  CVS: Maintain adequate systolic blood pressure per neurology's suggestions RS: Mass most consistent with lung cancer ID: No acute issues ENDO: Diabetes mellitus is on sliding scale insulin --Sliding scale insulin GI: H2 blocker, n.p.o. may need tube feeds in the near future RENAL: Follow serum creatinine replete electrolytes as needed  CNS: Acute CVA per neurology y. HEMATOLOGY: Monitor hemoglobin transfuse per protocol  MUSCULOSKELETAL: No acute issues PAIN AND SEDATION: Currently sedated with propofol  Skin/Wound: Chronic changes   Electrolytes: Replace electrolytes per ICU electrolyte replacement protocol.   IVF: none  Nutrition: NPO for now  Prophylaxis: DVT Prophylaxis with SCD start heparin when ok by neurology,. GI Prophylaxis.     Subjective: Diane Olson is a 72 year old woman with a history of tobacco abuse, hypertension, and thyroid disease.  She smoked about half a pack a day for 60 years before quitting about a month ago.  She was treated for pneumonia in January.  She completed her  course of antibiotics but had a lingering cough.  She did not think too much about this initially but developed hemoptysis.  She went to the emergency room.  A CT of the chest showed a left lower lobe mass with bulky hilar and mediastinal adenopathy.  She was referred to Dr. Julien Nordmann.  He did a PET/CT and MRI of the brain.  The MR showed no evidence of brain metastases.  PET showed the left lower lobe cavitary mass hilar paratracheal and subcarinal lymph nodes were markedly hypermetabolic.  She has had a poor appetite.  She lost about 17 pounds over 3 months although she has gained back about 4 pounds over the past week.  She has a constant nagging cough.  It is often productive.  She still sees occasional blood.  She also has had a lot of wheezing.   Patient came for EBUS procedure under CT surgery. During procedure patient was found to have CVA. Details of procedure is not available as all notes are not available in system and patient is intubated and sedated. Initial eval suggest hypoxic injury and possible pontine stroke. Neurology already evaluating patient and seems like patient is also having seizure and was suppose to be started on keppra.   STUDIES:  4/18 CXR LLL more confluent suggest post bronch changes 4/18 MR consistent with widespread hypoxic insult. 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 Slow flow LEFT vertebral artery, potential subclavian steal.Interval growth of subcarinal mass/lymphadenopathy now occluding the LEFT mainstem bronchus. Pneumomediastinum.  3/14 CT Chest Cavitary left lower lobe mass highly suspicious for bronchogenic carcinoma. There is associated central mediastinal lymphadenopathy  with mass effect on the tracheobronchial tree and possible tracheal invasion.  CULTURES: none  ANTIBIOTICS: none  SIGNIFICANT EVENTS: 4/18 EBUS --  CVA  LINES/TUBES:     Vital Signs:   BP 113/87   Pulse (!) 132   Temp 99.3 F (37.4 C)   Resp 16   Ht 5' 3" (1.6 m)   Wt 51.2 kg (112 lb 14 oz)   SpO2 97%   BMI 20.00 kg/m       Prehospital Care Cardiac Rhythm: Supraventricular tachycardia  Temp (24hrs), Avg:98 F (36.7 C), Min:93.6 F (34.2 C), Max:99.3 F (37.4 C)   Intake/Output:Last shift:      No intake/output data recorded.Last 3 shifts: 04/17 1901 - 04/19 0700 In: 1630.3 [I.V.:1130.3] Out: 1200 [Urine:650]  Intake/Output Summary (Last 24 hours) at Nov 09, 2017 0927 Last data filed at 2017/11/09 0700 Gross per 24 hour  Intake 1630.32 ml  Output 1200 ml  Net 430.32 ml          Vent Mode: PRVC FiO2 (%):  [40 %-60 %] 40 % Set Rate:  [15 bmp] 15 bmp Vt Set:  [420 mL] 420 mL PEEP:  [5 cmH20] 5 cmH20 Plateau Pressure:  [16 cmH20-21 cmH20] 16 cmH20  Objective:   General: Elderly female sedated on ventilator HEENT: Endotracheal tube to ventilator orogastric tube PSY: None available Neuro: Heavily sedated, facial twitching is noted. CV: Heart sounds are regular.  Currently in a heart rate of 130 despite being on amiodarone drip PULM: On ventilatory support, decreased breath sounds in the bases bases more pronounced on the right RY:GBBH, non-tender, bsx4 active  Extremities: warm/dry, 2+ edema  Skin: no rashes or lesions  DATA:   Labs:  CBC w/Diff Recent Labs  Lab 10/10/2017 0754 10/26/2017 1728 11/09/17 0226  HGB 10.8* 9.5* 10.0*  HCT 33.7* 30.5* 32.1*  WBC 10.4 22.9* 20.2*  PLT 362 301 325     Chemistry Recent Labs  Lab 10/04/2017 0754 10/19/2017 1728 11-09-17 0226  NA 139 137 140  K 3.7 3.9 4.3  CL 102 102 101  CO2 _0 BUN _1 CREATININE 0.67 0.74 0.78  GLUCOSE 109* 203* 156*    Hepatic Function Latest Ref Rng & Units 10/19/2017 10/27/2017 09/15/2017  Total Protein 6.5 - 8.1 g/dL 6.0(L) 6.7 7.8  Albumin 3.5 - 5.0 g/dL 2.7(L) 2.5(L) 3.9  AST 15 - 41 U/L 45(H) 29 11  ALT 14 - 54  U/L 35 38 12  Alk Phosphatase 38 - 126 U/L 64 70 59  Total Bilirubin 0.3 - 1.2 mg/dL 1.0 0.7 0.4     Lactic Acid No results found for: LATICACIDVEN   Micro  _2 @ Recent Results (from the past 720 hour(s))  MRSA PCR Screening     Status: Abnormal   Collection Time: 10/01/2017 11:24 PM  Result Value Ref Range Status   MRSA by PCR POSITIVE (A) NEGATIVE Final    Comment:        The GeneXpert MRSA Assay (FDA approved for NASAL specimens only), is one component of a comprehensive MRSA colonization surveillance program. It is not intended to diagnose MRSA infection nor to guide or monitor treatment for MRSA infections. RESULT CALLED TO, READ BACK BY AND VERIFIED WITH: Glade Stanford RN Nov 09, 2017 0304 JDW      ABG    Component Value Date/Time   PHART 7.353 Nov 09, 2017 0447   PCO2ART 53.5 (H) 2017-11-09 0447   PO2ART 161 (H) 11-09-2017 0447   HCO3 29.0 (H)  2017/11/02 0447   TCO2 29 10/13/2017 1742   O2SAT 98.9 02-Nov-2017 0447     Cardiac Enzymes No results for input(s): CKTOTAL, CKMB, TROPONINI, RELINDX in the last 72 hours.    Coagulation Lab Results  Component Value Date   INR 1.16 10/11/2017   APTT 31 10/20/2017     Radiology Results Reviewed by me and compared with previous imaging studies Ct Angio Head W Or Wo Contrast  Result Date: 10/19/2017 CLINICAL DATA:  Follow-up code stroke. History of hypertension and diabetes. EXAM: CT ANGIOGRAPHY HEAD AND NECK TECHNIQUE: Multidetector CT imaging of the head and neck was performed using the standard protocol during bolus administration of intravenous contrast. Multiplanar CT image reconstructions and MIPs were obtained to evaluate the vascular anatomy. Carotid stenosis measurements (when applicable) are obtained utilizing NASCET criteria, using the distal internal carotid diameter as the denominator. CONTRAST:  54m ISOVUE-370 IOPAMIDOL (ISOVUE-370) INJECTION 76% COMPARISON:  MRA of the head and MRA neck October 16, 2017 PE. Recent chest  September 01, 2016 FINDINGS: CTA NECK FINDINGS: AORTIC ARCH: Normal appearance of the thoracic arch, normal branch pattern. Moderate calcific atherosclerosis. The origins of the innominate, left Common carotid artery and subclavian artery are patent, though there is severe stenosis LEFT subclavian artery origin. RIGHT CAROTID SYSTEM: Common carotid artery is widely patent, coursing in a straight line fashion. Mild calcific atherosclerosis of carotid bifurcation without hemodynamically significant stenosis by NASCET criteria. Normal appearance of the internal carotid artery. LEFT CAROTID SYSTEM: Common carotid artery is widely patent, coursing in a straight line fashion. Moderate calcific atherosclerosis carotid bifurcation without hemodynamically significant stenosis by NASCET criteria. Normal appearance of the internal carotid artery. VERTEBRAL ARTERIES:Severe stenosis LEFT vertebral artery origin with decreased contrast opacification. RIGHT vertebral artery is dominant. SKELETON: No acute osseous process though bone windows have not been submitted. Severe C4-5 and C5-6 degenerative discs. Focal sclerosis RIGHT a 3 year, RIGHT C4, bilateral C5, bilateral C6 sclerosis. No OTHER NECK: Soft tissues of the neck are nonacute though, not tailored for evaluation. UPPER CHEST: 5.1 x 4.8 cm subcarinal mass was RIGHT 3 x 5.2 cm. RIGHT hilar lymphadenopathy. There is now focal obstruction LEFT mainstem bronchus. Centrilobular emphysema. Life-support lines in place. New pneumomediastinum tracking into the neck. CTA HEAD FINDINGS: ANTERIOR CIRCULATION: Patent cervical internal carotid arteries, petrous, cavernous and supra clinoid internal carotid arteries. Patent anterior communicating artery. Patent anterior and middle cerebral arteries, mild luminal irregularity compatible with atherosclerosis. No large vessel occlusion, significant stenosis, contrast extravasation or aneurysm. POSTERIOR CIRCULATION: Patent vertebral arteries,  vertebrobasilar junction and basilar artery, as well as main branch vessels. Patent posterior cerebral arteries, mild luminal irregularity compatible with atherosclerosis. No large vessel occlusion, significant stenosis, contrast extravasation or aneurysm. VENOUS SINUSES: Major dural venous sinuses are patent though not tailored for evaluation on this angiographic examination. ANATOMIC VARIANTS: None. DELAYED PHASE: No abnormal intracranial enhancement. MIP images reviewed. IMPRESSION: CTA NECK: 1. No hemodynamically significant stenosis of the carotid arteries. 2. 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 Slow flow LEFT vertebral artery, potential subclavian steal. 3. Interval growth of subcarinal mass/lymphadenopathy now occluding the LEFT mainstem bronchus. New pneumomediastinum tracking to neck. 4. Multifocal cervical spine sclerosis equivocal for metastasis. CTA HEAD: 1. No emergent large vessel occlusion or flow limiting stenosis. 2. Mild intracranial atherosclerosis. Critical Value/emergent results text paged to [facility] [employer] secure system on 10/09/2017 at 5:53 pm, including interpreting physician's phone number. Electronically Signed   By: CThana FarrD.  On: 10/21/2017 17:54   Dg Chest 2 View  Result Date: 10/18/2017 CLINICAL DATA:  Preop evaluation for bronchoscopy EXAM: CHEST - 2 VIEW COMPARISON:  09/26/2017, PET-CT, 09/01/2017 chest x-ray FINDINGS: Cardiac shadow is stable. Central left mass lesion is noted similar to that seen on prior CT examinations. Increasing peripheral infiltrative density is noted in the left lower lobe when compare with the prior exam likely related to central bronchial obstruction. IMPRESSION: Persistent central left lower lobe mass with increasing peripheral consolidation. Electronically Signed   By: Inez Catalina M.D.   On: 10/25/2017 08:14   Ct Angio Neck W Or Wo Contrast  Result Date: 10/12/2017 CLINICAL DATA:   Follow-up code stroke. History of hypertension and diabetes. EXAM: CT ANGIOGRAPHY HEAD AND NECK TECHNIQUE: Multidetector CT imaging of the head and neck was performed using the standard protocol during bolus administration of intravenous contrast. Multiplanar CT image reconstructions and MIPs were obtained to evaluate the vascular anatomy. Carotid stenosis measurements (when applicable) are obtained utilizing NASCET criteria, using the distal internal carotid diameter as the denominator. CONTRAST:  7m ISOVUE-370 IOPAMIDOL (ISOVUE-370) INJECTION 76% COMPARISON:  MRA of the head and MRA neck October 16, 2017 PE. Recent chest September 01, 2016 FINDINGS: CTA NECK FINDINGS: AORTIC ARCH: Normal appearance of the thoracic arch, normal branch pattern. Moderate calcific atherosclerosis. The origins of the innominate, left Common carotid artery and subclavian artery are patent, though there is severe stenosis LEFT subclavian artery origin. RIGHT CAROTID SYSTEM: Common carotid artery is widely patent, coursing in a straight line fashion. Mild calcific atherosclerosis of carotid bifurcation without hemodynamically significant stenosis by NASCET criteria. Normal appearance of the internal carotid artery. LEFT CAROTID SYSTEM: Common carotid artery is widely patent, coursing in a straight line fashion. Moderate calcific atherosclerosis carotid bifurcation without hemodynamically significant stenosis by NASCET criteria. Normal appearance of the internal carotid artery. VERTEBRAL ARTERIES:Severe stenosis LEFT vertebral artery origin with decreased contrast opacification. RIGHT vertebral artery is dominant. SKELETON: No acute osseous process though bone windows have not been submitted. Severe C4-5 and C5-6 degenerative discs. Focal sclerosis RIGHT a 3 year, RIGHT C4, bilateral C5, bilateral C6 sclerosis. No OTHER NECK: Soft tissues of the neck are nonacute though, not tailored for evaluation. UPPER CHEST: 5.1 x 4.8 cm subcarinal mass was  RIGHT 3 x 5.2 cm. RIGHT hilar lymphadenopathy. There is now focal obstruction LEFT mainstem bronchus. Centrilobular emphysema. Life-support lines in place. New pneumomediastinum tracking into the neck. CTA HEAD FINDINGS: ANTERIOR CIRCULATION: Patent cervical internal carotid arteries, petrous, cavernous and supra clinoid internal carotid arteries. Patent anterior communicating artery. Patent anterior and middle cerebral arteries, mild luminal irregularity compatible with atherosclerosis. No large vessel occlusion, significant stenosis, contrast extravasation or aneurysm. POSTERIOR CIRCULATION: Patent vertebral arteries, vertebrobasilar junction and basilar artery, as well as main branch vessels. Patent posterior cerebral arteries, mild luminal irregularity compatible with atherosclerosis. No large vessel occlusion, significant stenosis, contrast extravasation or aneurysm. VENOUS SINUSES: Major dural venous sinuses are patent though not tailored for evaluation on this angiographic examination. ANATOMIC VARIANTS: None. DELAYED PHASE: No abnormal intracranial enhancement. MIP images reviewed. IMPRESSION: CTA NECK: 1. No hemodynamically significant stenosis of the carotid arteries. 2. 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 Slow flow LEFT vertebral artery, potential subclavian steal. 3. Interval growth of subcarinal mass/lymphadenopathy now occluding the LEFT mainstem bronchus. New pneumomediastinum tracking to neck. 4. Multifocal cervical spine sclerosis equivocal for metastasis. CTA HEAD: 1. No emergent large vessel occlusion or flow limiting stenosis. 2. Mild intracranial  atherosclerosis. Critical Value/emergent results text paged to [place] via AMION secure system on 10/09/2017 at 5:53 pm, including interpreting physician's phone number. Electronically Signed   By: Elon Alas M.D.   On: 10/19/2017 17:54   Mr Jodene Nam Head Wo Contrast  Result Date:  10/11/2017 CLINICAL DATA:  Hypoperfusion episode during procedure. EXAM: MRI HEAD WITHOUT CONTRAST MRA HEAD WITHOUT CONTRAST MRA NECK WITHOUT CONTRAST TECHNIQUE: Multiplanar, multiecho pulse sequences of the brain and surrounding structures were obtained without intravenous contrast. Angiographic images of the Circle of Willis were obtained using MRA technique without intravenous contrast. Angiographic images of the neck were obtained using MRA technique without intravenous contrast. Carotid stenosis measurements (when applicable) are obtained utilizing NASCET criteria, using the distal internal carotid diameter as the denominator. COMPARISON:  09/26/2017 FINDINGS: MRI HEAD FINDINGS Brain: Diffusion imaging shows a 1.5 cm region of early restricted diffusion in the left pons consistent with infarction. There a few scattered punctate foci of restricted diffusion in the cerebellum. Cerebral hemispheres show widespread areas of cortical restricted diffusion probably in the developing phase most notable in the right posteromedial temporal lobe and occipital lobe, the left posterior parietal lobe and along the gyral surfaces at the frontoparietal vertices. Findings most consistent with diffuse hypoxic insult. The brain shows a background pattern of chronic small vessel ischemic changes within the deep and subcortical white matter. There could possibly be a small amount of subarachnoid hemorrhage in a left parietal sulcus, not definite. There is scattered punctate foci of hemosiderin deposition within the brain related to old small vessel ischemic insults. Vascular: Major vessels at the base of the brain show flow. Skull and upper cervical spine: Negative Sinuses/Orbits: Clear/normal Other: None MRA HEAD FINDINGS Both internal carotid arteries are widely patent into the brain. No siphon stenosis. The anterior and middle cerebral vessels are patent without proximal stenosis, aneurysm or vascular malformation. Right  vertebral artery shows antegrade flow to the basilar. There is diminished flow in the left vertebral artery, possibly retrograde or diminished antegrade. There is abnormal flow in this vessel on the previous study. No basilar stenosis. Posterior circulation branch vessels show flow. Distal branch vessels do show some atherosclerotic irregularity. MRA NECK FINDINGS Both common carotid arteries show flow to the bifurcation. No carotid bifurcation stenosis on either side. Antegrade flow in the dominant right vertebral artery. No antegrade flow seen in the left vertebral artery. IMPRESSION: Findings most consistent with widespread hypoxic insult. 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 Widespread cortical early restricted diffusion which may become more evident over time. Question small amount of subarachnoid hemorrhage in a left parietal sulcus, not definite. Background pattern of chronic small vessel ischemic changes of the white matter. No significant anterior circulation finding on the MR angiogram studies. 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 Retrograde flow demonstrated on the intracranial study. Dominant right vertebral artery widely patent to the basilar. Electronically Signed   By: Nelson Chimes M.D.   On: 10/22/2017 16:15   Mr Jodene Nam Neck Wo Contrast  Result Date: 10/05/2017 CLINICAL DATA:  Hypoperfusion episode during procedure. EXAM: MRI HEAD WITHOUT CONTRAST MRA HEAD WITHOUT CONTRAST MRA NECK WITHOUT CONTRAST TECHNIQUE: Multiplanar, multiecho pulse sequences of the brain and surrounding structures were obtained without intravenous contrast. Angiographic images of the Circle of Willis were obtained using MRA technique without intravenous contrast. Angiographic images of the neck were obtained using MRA technique without intravenous contrast. Carotid stenosis measurements (when applicable) are obtained utilizing NASCET criteria, using  the distal internal carotid diameter as  the denominator. COMPARISON:  09/26/2017 FINDINGS: MRI HEAD FINDINGS Brain: Diffusion imaging shows a 1.5 cm region of early restricted diffusion in the left pons consistent with infarction. There a few scattered punctate foci of restricted diffusion in the cerebellum. Cerebral hemispheres show widespread areas of cortical restricted diffusion probably in the developing phase most notable in the right posteromedial temporal lobe and occipital lobe, the left posterior parietal lobe and along the gyral surfaces at the frontoparietal vertices. Findings most consistent with diffuse hypoxic insult. The brain shows a background pattern of chronic small vessel ischemic changes within the deep and subcortical white matter. There could possibly be a small amount of subarachnoid hemorrhage in a left parietal sulcus, not definite. There is scattered punctate foci of hemosiderin deposition within the brain related to old small vessel ischemic insults. Vascular: Major vessels at the base of the brain show flow. Skull and upper cervical spine: Negative Sinuses/Orbits: Clear/normal Other: None MRA HEAD FINDINGS Both internal carotid arteries are widely patent into the brain. No siphon stenosis. The anterior and middle cerebral vessels are patent without proximal stenosis, aneurysm or vascular malformation. Right vertebral artery shows antegrade flow to the basilar. There is diminished flow in the left vertebral artery, possibly retrograde or diminished antegrade. There is abnormal flow in this vessel on the previous study. No basilar stenosis. Posterior circulation branch vessels show flow. Distal branch vessels do show some atherosclerotic irregularity. MRA NECK FINDINGS Both common carotid arteries show flow to the bifurcation. No carotid bifurcation stenosis on either side. Antegrade flow in the dominant right vertebral artery. No antegrade flow seen in the left vertebral artery. IMPRESSION: Findings most consistent with  widespread hypoxic insult. 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 Widespread cortical early restricted diffusion which may become more evident over time. Question small amount of subarachnoid hemorrhage in a left parietal sulcus, not definite. Background pattern of chronic small vessel ischemic changes of the white matter. No significant anterior circulation finding on the MR angiogram studies. 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 Retrograde flow demonstrated on the intracranial study. Dominant right vertebral artery widely patent to the basilar. Electronically Signed   By: Nelson Chimes M.D.   On: 10/02/2017 16:15   Mri Brain Without Contrast  Result Date: 10/12/2017 CLINICAL DATA:  Hypoperfusion episode during procedure. EXAM: MRI HEAD WITHOUT CONTRAST MRA HEAD WITHOUT CONTRAST MRA NECK WITHOUT CONTRAST TECHNIQUE: Multiplanar, multiecho pulse sequences of the brain and surrounding structures were obtained without intravenous contrast. Angiographic images of the Circle of Willis were obtained using MRA technique without intravenous contrast. Angiographic images of the neck were obtained using MRA technique without intravenous contrast. Carotid stenosis measurements (when applicable) are obtained utilizing NASCET criteria, using the distal internal carotid diameter as the denominator. COMPARISON:  09/26/2017 FINDINGS: MRI HEAD FINDINGS Brain: Diffusion imaging shows a 1.5 cm region of early restricted diffusion in the left pons consistent with infarction. There a few scattered punctate foci of restricted diffusion in the cerebellum. Cerebral hemispheres show widespread areas of cortical restricted diffusion probably in the developing phase most notable in the right posteromedial temporal lobe and occipital lobe, the left posterior parietal lobe and along the gyral surfaces at the frontoparietal vertices. Findings most consistent with diffuse hypoxic insult. The brain shows a  background pattern of chronic small vessel ischemic changes within the deep and subcortical white matter. There could possibly be a small amount of subarachnoid hemorrhage in a  left parietal sulcus, not definite. There is scattered punctate foci of hemosiderin deposition within the brain related to old small vessel ischemic insults. Vascular: Major vessels at the base of the brain show flow. Skull and upper cervical spine: Negative Sinuses/Orbits: Clear/normal Other: None MRA HEAD FINDINGS Both internal carotid arteries are widely patent into the brain. No siphon stenosis. The anterior and middle cerebral vessels are patent without proximal stenosis, aneurysm or vascular malformation. Right vertebral artery shows antegrade flow to the basilar. There is diminished flow in the left vertebral artery, possibly retrograde or diminished antegrade. There is abnormal flow in this vessel on the previous study. No basilar stenosis. Posterior circulation branch vessels show flow. Distal branch vessels do show some atherosclerotic irregularity. MRA NECK FINDINGS Both common carotid arteries show flow to the bifurcation. No carotid bifurcation stenosis on either side. Antegrade flow in the dominant right vertebral artery. No antegrade flow seen in the left vertebral artery. IMPRESSION: Findings most consistent with widespread hypoxic insult. 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 Widespread cortical early restricted diffusion which may become more evident over time. Question small amount of subarachnoid hemorrhage in a left parietal sulcus, not definite. Background pattern of chronic small vessel ischemic changes of the white matter. No significant anterior circulation finding on the MR angiogram studies. 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 Retrograde flow demonstrated on the intracranial study. Dominant right vertebral artery widely patent to the basilar. Electronically Signed   By: Nelson Chimes M.D.   On: 10/01/2017 16:15   Dg Chest Port 1 View  Result Date: 10/14/2017 CLINICAL DATA:  Shortness of breath. EXAM: PORTABLE CHEST 1 VIEW COMPARISON:  10/19/2017 FINDINGS: Patient has been intubated. ET tube lies 3.2 cm above carina. Enteric tube tip lies in the stomach which is moderately distended. LEFT lower lobe mass demonstrates increased conspicuity which could represent hemorrhage post bronchoscopy. No pneumothorax. IMPRESSION: Support tubes and lines as described. Increased conspicuity of the LEFT lower lobe mass which could represent post bronchoscopy hemorrhage. No pneumothorax. Electronically Signed   By: Staci Righter M.D.   On: 10/18/2017 18:20      [facility]

## 2017-10-29 DEATH — deceased

## 2017-10-30 ENCOUNTER — Encounter (HOSPITAL_COMMUNITY): Payer: Self-pay | Admitting: Internal Medicine

## 2017-10-30 ENCOUNTER — Ambulatory Visit: Payer: Medicare Other

## 2017-10-31 ENCOUNTER — Ambulatory Visit: Payer: Medicare Other

## 2017-11-03 ENCOUNTER — Ambulatory Visit: Payer: Medicare Other

## 2017-11-04 ENCOUNTER — Ambulatory Visit: Payer: Medicare Other

## 2017-11-05 ENCOUNTER — Ambulatory Visit: Payer: Medicare Other

## 2017-11-06 ENCOUNTER — Ambulatory Visit: Payer: Medicare Other

## 2017-11-07 ENCOUNTER — Ambulatory Visit: Payer: Medicare Other

## 2017-11-10 ENCOUNTER — Ambulatory Visit: Payer: Medicare Other

## 2017-11-11 ENCOUNTER — Ambulatory Visit: Payer: Medicare Other

## 2017-11-11 NOTE — Progress Notes (Signed)
  Radiation Oncology         [phone] ________________________________  Name: Diane Olson MRN: [id number]  Date: 10/15/2017  DOB: [birth year]  SIMULATION AND TREATMENT PLANNING NOTE  DIAGNOSIS:     ICD-10-CM   1. 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  Identity was confirmed.  All relevant records and images related to the planned course of therapy were reviewed.   Written consent to proceed with treatment was confirmed which was freely given after reviewing the details related to the planned course of therapy had been reviewed with the patient.  Then, the patient was set-up in a stable reproducible  supine position for radiation therapy.  CT images were obtained.  Surface markings were placed.    Medically necessary complex treatment device(s) for immobilization:  Vac-lock.   The CT images were loaded into the planning software.  Then the target and avoidance structures were contoured.  Treatment planning then occurred.  The radiation prescription was entered and confirmed.  A total of 8 complex treatment devices were fabricated which relate to the designed radiation treatment fields. Each of these customized fields/ complex treatment devices will be used on a daily basis during the radiation course. I have requested : Intensity Modulated Radiotherapy (IMRT) is medically necessary for this case for the following reason: Sparing of adjacent critical normal structures including the spinal cord, heart, and lung.   The patient will undergo daily image guidance to ensure accurate localization of the target, and adequate minimize dose to the normal surrounding structures in close proximity to the target.   PLAN:  The patient will receive 60 Gy in 30 fractions initially.  The patient will then receive a 6 Gy boost for a total dose of 66 Gy.  Special treatment  procedure The patient will also receive concurrent chemotherapy during the treatment. The patient may therefore experience increased toxicity or side effects and the patient will be monitored for such problems. This may require extra lab work as necessary. This therefore constitutes a special treatment procedure.  ________________________________   Jodelle Gross, MD, PhD

## 2017-11-11 NOTE — Progress Notes (Signed)
  Radiation Oncology         [phone] ________________________________  Name: Shaira Sova MRN: [id number]  Date: 10/15/2017  DOB: [birth year]  RESPIRATORY MOTION MANAGEMENT SIMULATION  NARRATIVE:  In order to account for effect of respiratory motion on target structures and other organs in the planning and delivery of radiotherapy, this patient underwent respiratory motion management simulation.  To accomplish this, when the patient was brought to the CT simulation planning suite, 4D respiratoy motion management CT images were obtained.  The CT images were loaded into the planning software.  Then, using a variety of tools including Cine, MIP, and standard views, the target volume and planning target volumes (PTV) were delineated.  Avoidance structures were contoured.  Treatment planning then occurred.  Dose volume histograms were generated and reviewed for each of the requested structure.  The resulting plan was carefully reviewed and approved today.   ------------------------------------------------  Jodelle Gross, MD, PhD

## 2017-11-12 ENCOUNTER — Ambulatory Visit: Payer: Medicare Other

## 2017-11-13 ENCOUNTER — Ambulatory Visit: Payer: Medicare Other

## 2017-11-14 ENCOUNTER — Ambulatory Visit: Payer: Medicare Other

## 2017-11-17 ENCOUNTER — Ambulatory Visit: Payer: Medicare Other

## 2017-11-18 ENCOUNTER — Ambulatory Visit: Payer: Medicare Other

## 2017-11-19 ENCOUNTER — Ambulatory Visit: Payer: Medicare Other

## 2017-11-20 ENCOUNTER — Ambulatory Visit: Payer: Medicare Other

## 2017-11-21 ENCOUNTER — Ambulatory Visit: Payer: Medicare Other

## 2017-11-25 ENCOUNTER — Ambulatory Visit: Payer: Medicare Other

## 2017-11-26 ENCOUNTER — Ambulatory Visit: Payer: Medicare Other

## 2017-11-27 ENCOUNTER — Ambulatory Visit: Payer: Medicare Other

## 2017-11-28 ENCOUNTER — Ambulatory Visit: Payer: Medicare Other

## 2017-12-01 ENCOUNTER — Ambulatory Visit: Payer: Medicare Other

## 2017-12-02 ENCOUNTER — Ambulatory Visit: Payer: Medicare Other

## 2017-12-03 ENCOUNTER — Ambulatory Visit: Payer: Medicare Other

## 2017-12-04 ENCOUNTER — Ambulatory Visit: Payer: Medicare Other

## 2018-06-17 IMAGING — CT CT ANGIO HEAD
1 of 8 series · 6 of 33 positions shown · IV contrast (APPLIED)
Comparison: MRA of the head and MRA neck October 16, 2017 PE. Recent
chest September 01, 2016

CLINICAL DATA: Follow-up code stroke. History of hypertension and
diabetes.

EXAM:
CT ANGIOGRAPHY HEAD AND NECK
TECHNIQUE: Multidetector CT imaging of the head and neck was performed using
the standard protocol during bolus administration of intravenous
contrast. Multiplanar CT image reconstructions and MIPs were
obtained to evaluate the vascular anatomy. Carotid stenosis
measurements (when applicable) are obtained utilizing NASCET
criteria, using the distal internal carotid diameter as the
denominator.
CONTRAST:  50mL [id number] IOPAMIDOL ([id number]) INJECTION 76%

[Series 7: ax thins · axial · 0.39mm/px · z∈[-277,-25]mm · 6 of 354 slices shown]
[im 51/354  soft-tissue]
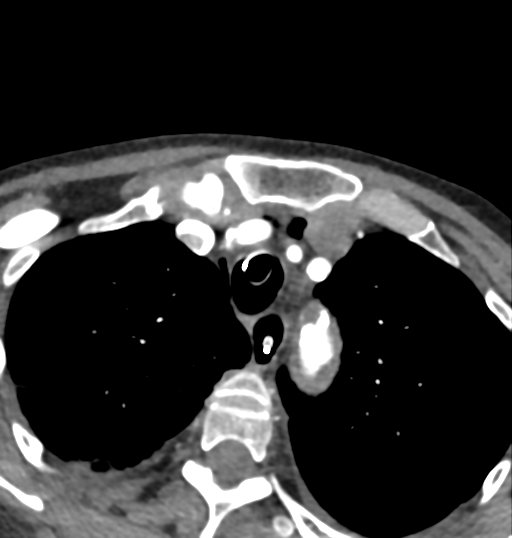
[im 101/354  bone]
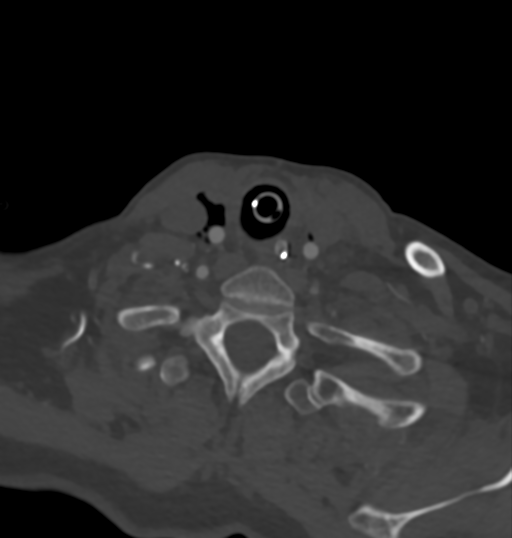
[im 152/354  soft-tissue]
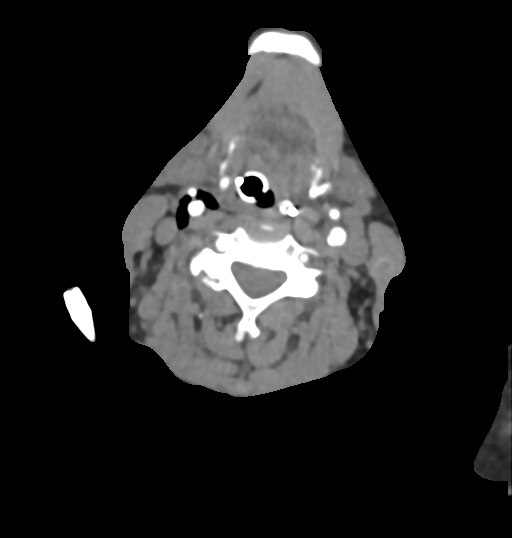
[im 202/354  bone]
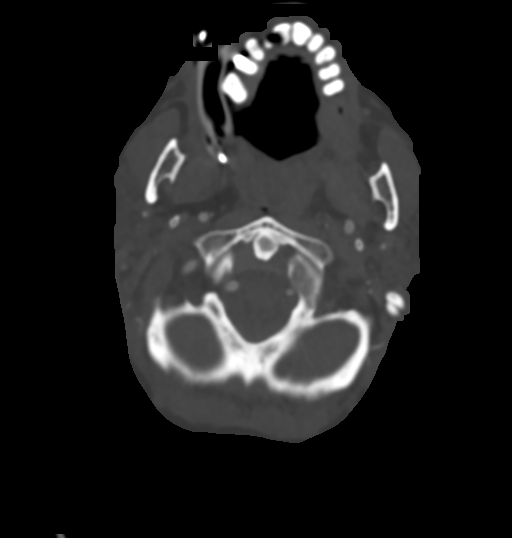
[im 253/354  soft-tissue]
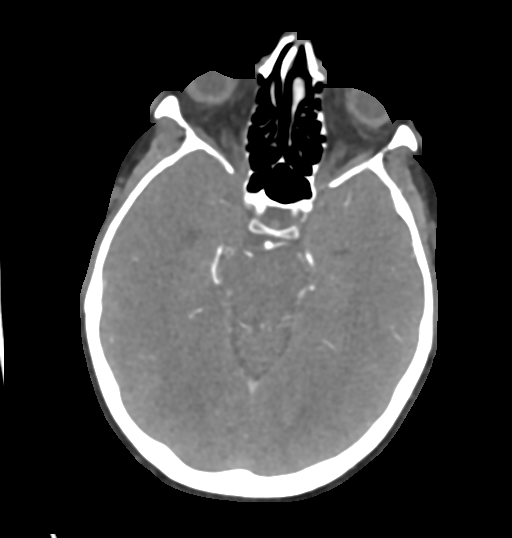
[im 303/354  bone]
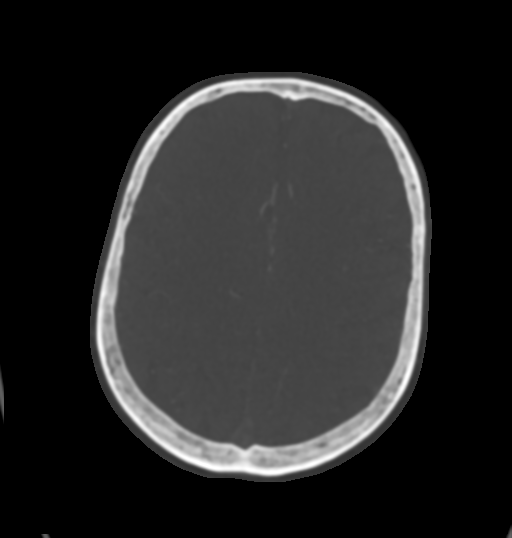

[6 of 33 positions shown; findings below may reference images not displayed]

FINDINGS: CTA NECK FINDINGS:

AORTIC ARCH: Normal appearance of the thoracic arch, normal branch
pattern. Moderate calcific atherosclerosis. The origins of the
innominate, left Common carotid artery and subclavian artery are
patent, though there is severe stenosis LEFT subclavian artery
origin.

RIGHT CAROTID SYSTEM: Common carotid artery is widely patent,
coursing in a straight line fashion. Mild calcific atherosclerosis
of carotid bifurcation without hemodynamically significant stenosis
by NASCET criteria. Normal appearance of the internal carotid
artery.

LEFT CAROTID SYSTEM: Common carotid artery is widely patent,
coursing in a straight line fashion. Moderate calcific
atherosclerosis carotid bifurcation without hemodynamically
significant stenosis by NASCET criteria. Normal appearance of the
internal carotid artery.

VERTEBRAL ARTERIES:Severe stenosis LEFT vertebral artery origin with
decreased contrast opacification. RIGHT vertebral artery is
dominant.

SKELETON: No acute osseous process though bone windows have not been
submitted. Severe C4-5 and C5-6 degenerative discs. Focal sclerosis
RIGHT a 3 year, RIGHT C4, bilateral C5, bilateral C6 sclerosis. No

OTHER NECK: Soft tissues of the neck are nonacute though, not
tailored for evaluation.

UPPER CHEST: [DATE] x 4.8 cm subcarinal mass was RIGHT 3 x 5.2 cm.
RIGHT hilar lymphadenopathy. There is now focal obstruction LEFT
mainstem bronchus. Centrilobular emphysema. Life-support lines in
place. New pneumomediastinum tracking into the neck.

CTA HEAD FINDINGS:

ANTERIOR CIRCULATION: Patent cervical internal carotid arteries,
petrous, cavernous and supra clinoid internal carotid arteries.
Patent anterior communicating artery. Patent anterior and middle
cerebral arteries, mild luminal irregularity compatible with
atherosclerosis.

No large vessel occlusion, significant stenosis, contrast
extravasation or aneurysm.

POSTERIOR CIRCULATION: Patent vertebral arteries, vertebrobasilar
junction and basilar artery, as well as main branch vessels. Patent
posterior cerebral arteries, mild luminal irregularity compatible
with atherosclerosis.

No large vessel occlusion, significant stenosis, contrast
extravasation or aneurysm.

VENOUS SINUSES: Major dural venous sinuses are patent though not
tailored for evaluation on this angiographic examination.

ANATOMIC VARIANTS: None.

DELAYED PHASE: No abnormal intracranial enhancement.

MIP images reviewed.
IMPRESSION: CTA NECK:

1. No hemodynamically significant stenosis of the carotid arteries.
2. Severe stenosis LEFT subclavian artery origin and LEFT vertebral
artery origin. Slow flow LEFT vertebral artery, potential subclavian
steal.
3. Interval growth of subcarinal mass/lymphadenopathy now occluding
the LEFT mainstem bronchus. New pneumomediastinum tracking to neck.
4. Multifocal cervical spine sclerosis equivocal for metastasis.

CTA HEAD:

1. No emergent large vessel occlusion or flow limiting stenosis.
2. Mild intracranial atherosclerosis.

Critical Value/emergent results text paged to Dr.LOTHEON REITCHIE via
[employer] secure system on 10/16/2017 at [DATE], including interpreting
physician's phone number.
